# Patient Record
Sex: Male | Born: 1975 | Race: White | Hispanic: No | Marital: Single | Smoking: Never smoker
Health system: Southern US, Community
[De-identification: ages and names within clinical notes are randomized; demographics above are authoritative.]

## PROBLEM LIST (undated history)

## (undated) DIAGNOSIS — M1990 Unspecified osteoarthritis, unspecified site: Secondary | ICD-10-CM

## (undated) DIAGNOSIS — F319 Bipolar disorder, unspecified: Secondary | ICD-10-CM

## (undated) DIAGNOSIS — E7800 Pure hypercholesterolemia, unspecified: Secondary | ICD-10-CM

## (undated) DIAGNOSIS — F32A Depression, unspecified: Secondary | ICD-10-CM

## (undated) DIAGNOSIS — N189 Chronic kidney disease, unspecified: Secondary | ICD-10-CM

## (undated) HISTORY — PX: NASAL SINUS SURGERY: SHX719

---

## 2017-12-23 ENCOUNTER — Other Ambulatory Visit: Payer: Self-pay | Admitting: Nephrology

## 2017-12-23 DIAGNOSIS — Z87448 Personal history of other diseases of urinary system: Secondary | ICD-10-CM

## 2017-12-23 DIAGNOSIS — I159 Secondary hypertension, unspecified: Secondary | ICD-10-CM

## 2017-12-23 DIAGNOSIS — N183 Chronic kidney disease, stage 3 unspecified: Secondary | ICD-10-CM

## 2017-12-28 ENCOUNTER — Ambulatory Visit
Admission: RE | Admit: 2017-12-28 | Discharge: 2017-12-28 | Disposition: A | Discharge status: Home / Self Care | Payer: Managed Care, Other (non HMO) | Source: Ambulatory Visit | Attending: Nephrology | Admitting: Nephrology

## 2017-12-28 DIAGNOSIS — I159 Secondary hypertension, unspecified: Secondary | ICD-10-CM

## 2017-12-28 DIAGNOSIS — N183 Chronic kidney disease, stage 3 unspecified: Secondary | ICD-10-CM

## 2017-12-28 DIAGNOSIS — Z87448 Personal history of other diseases of urinary system: Secondary | ICD-10-CM

## 2019-06-16 IMAGING — US US RENAL
1 series · 14 of 25 positions shown · non-contrast
Comparison: None in PACs

CLINICAL DATA: Chronic kidney disease stage 3. History of
hypertension.

EXAM:
RENAL / URINARY TRACT ULTRASOUND COMPLETE

[Series 1: us renal · 0.28mm/px · 14 of 41 slices shown]
[im 1/41]
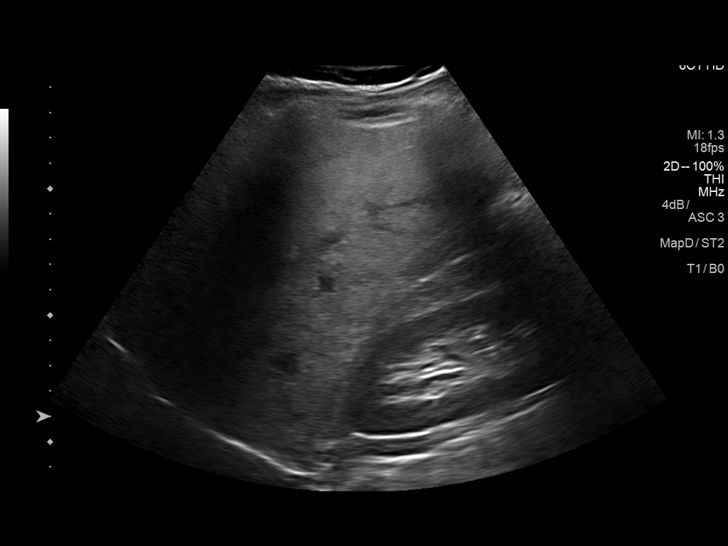
[im 4/41]
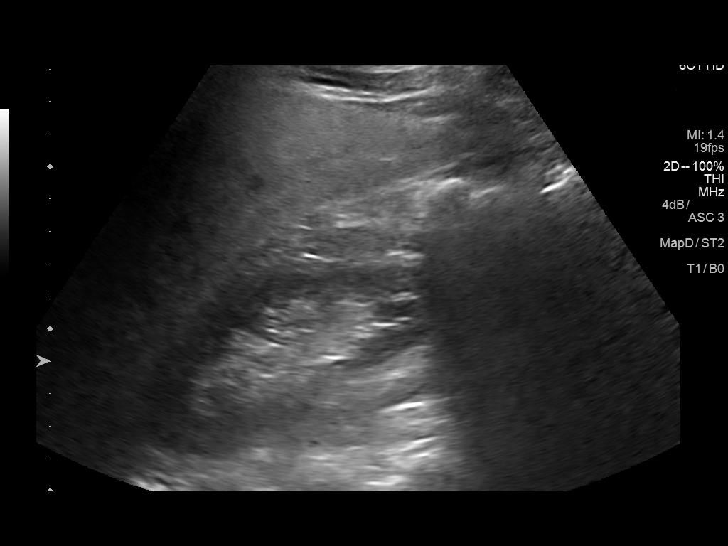
[im 7/41]
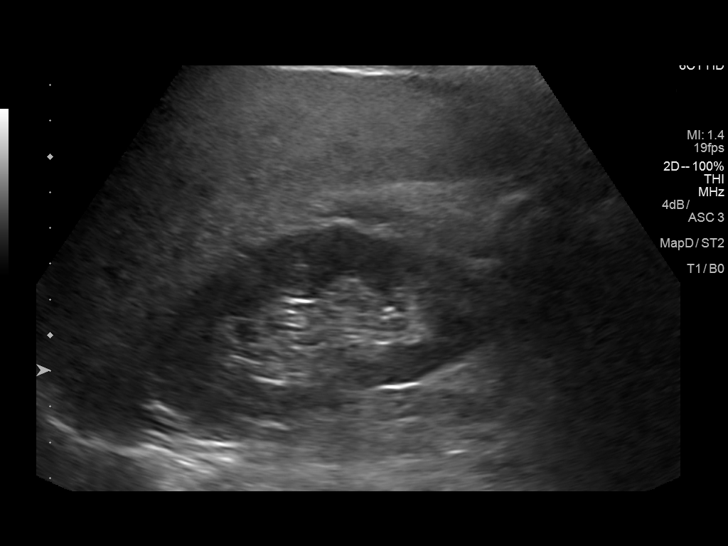
[im 11/41]
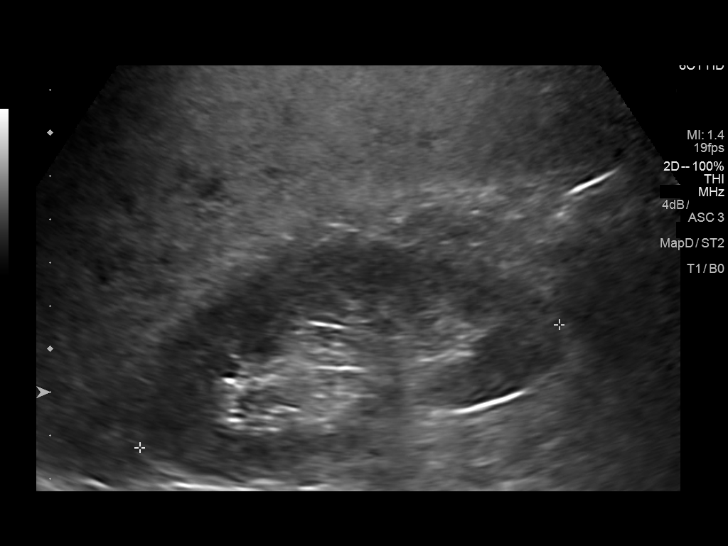
[im 14/41]
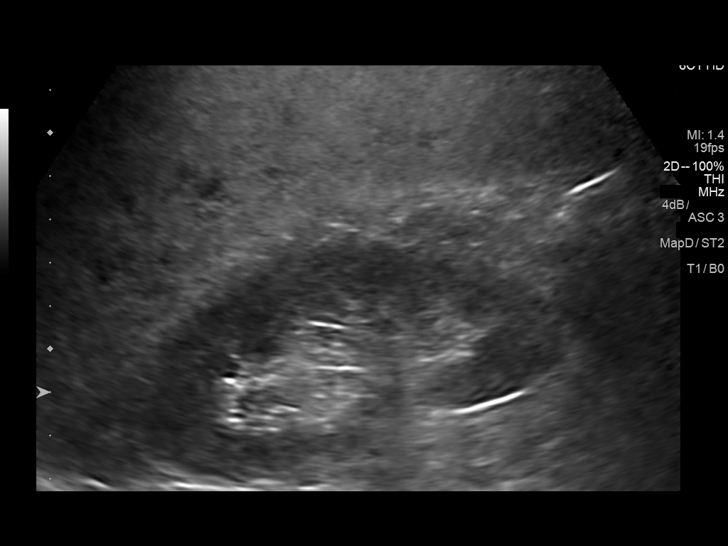
[im 16/41]
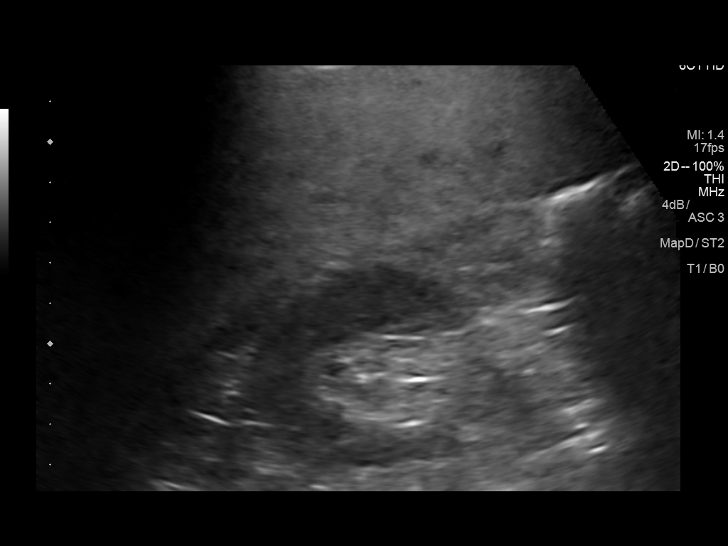
[im 19/41]
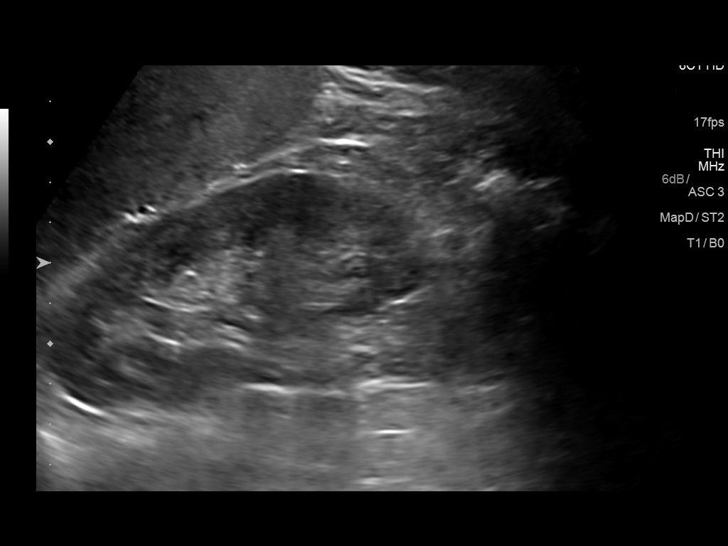
[im 22/41]
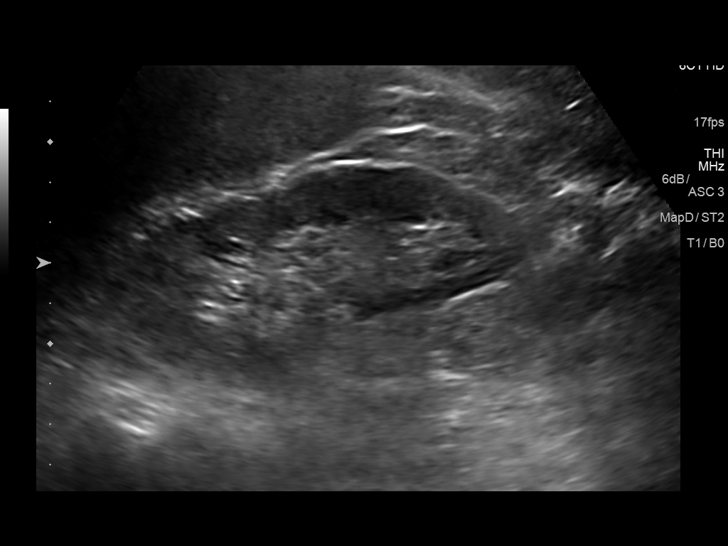
[im 26/41]
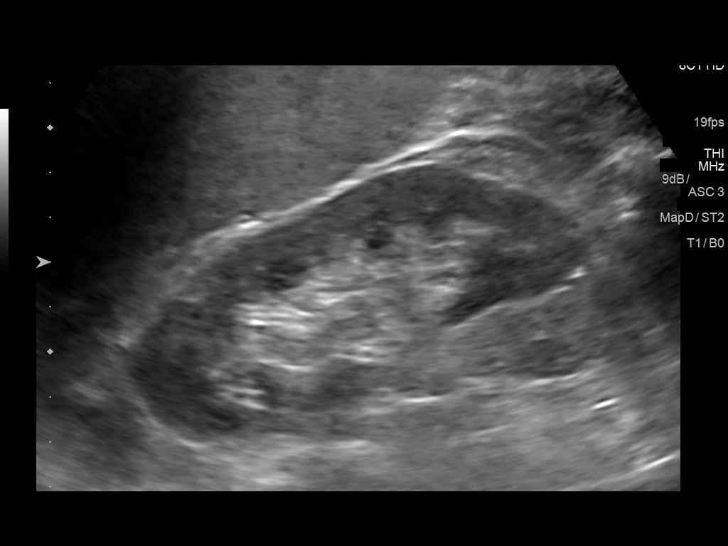
[im 27/41]
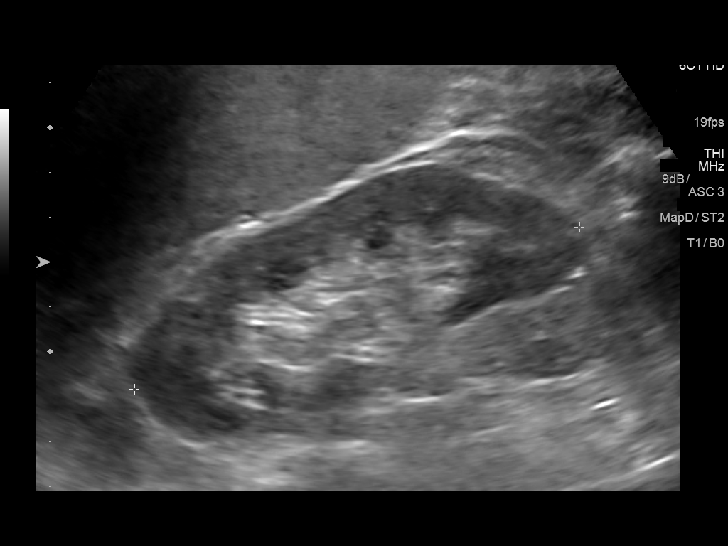
[im 31/41]
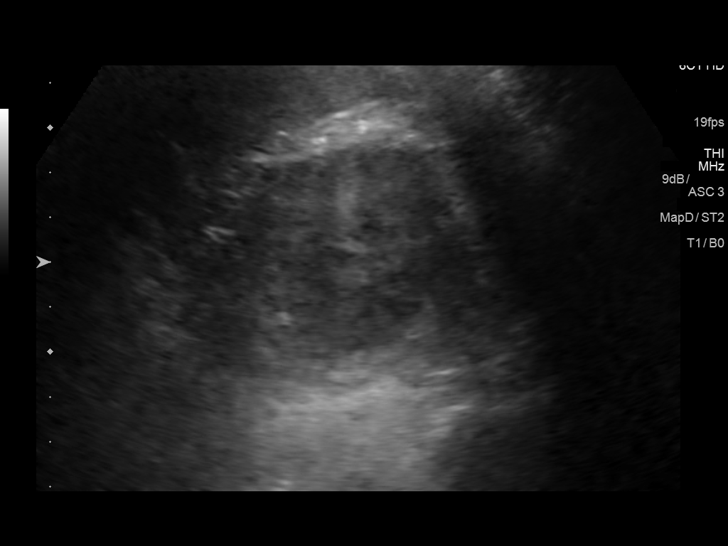
[im 34/41]
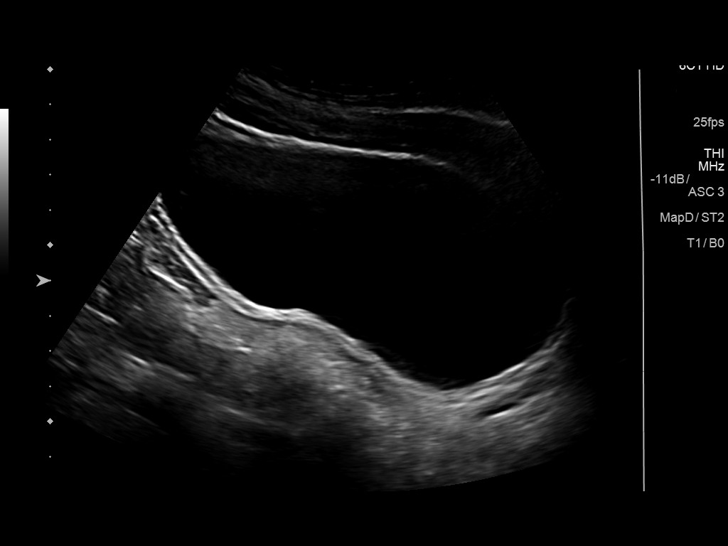
[im 37/41]
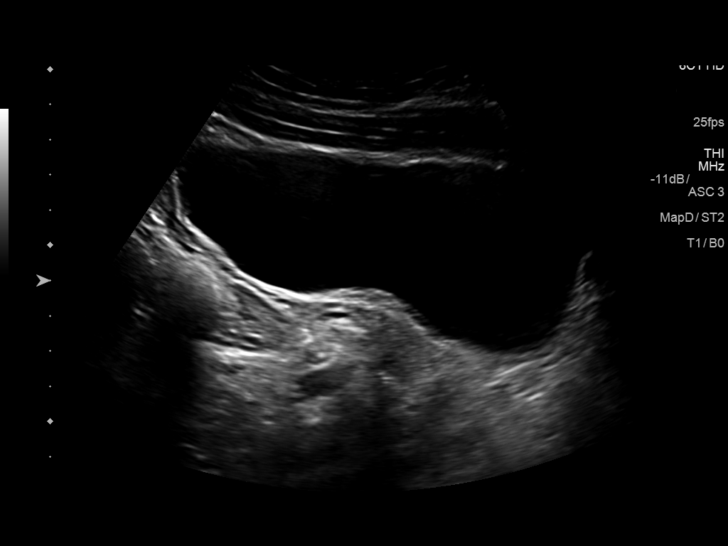
[im 41/41]
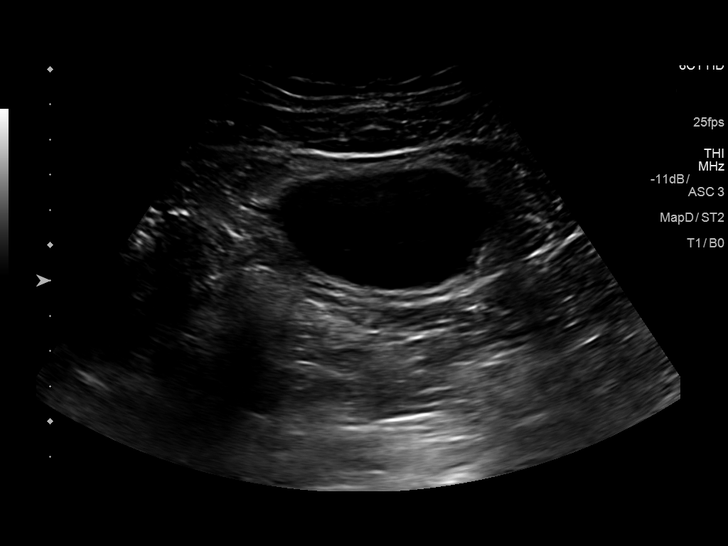

[14 of 25 positions shown; findings below may reference images not displayed]

FINDINGS: Right Kidney:

Length: 10.5 cm. Echogenicity within normal limits. No mass or
hydronephrosis visualized.

Left Kidney:

Length: 10.6 cm. Echogenicity within normal limits. No mass or
hydronephrosis visualized.

Bladder:

Appears normal for degree of bladder distention.
IMPRESSION: Normal ultrasound of the kidneys and urinary bladder.

## 2019-11-26 ENCOUNTER — Ambulatory Visit: Payer: Managed Care, Other (non HMO) | Attending: Internal Medicine

## 2019-11-26 DIAGNOSIS — Z23 Encounter for immunization: Secondary | ICD-10-CM

## 2019-11-26 NOTE — Progress Notes (Signed)
   Covid-19 Vaccination Clinic  Name:  Richard Sloan    MRN: [id number] DOB: 02-Feb-1976  11/26/2019  Mr. Richard Sloan was observed post Covid-19 immunization for 15 minutes without incident. He was provided with Vaccine Information Sheet and instruction to access the V-Safe system.   Mr. Richard Sloan was instructed to call 911 with any severe reactions post vaccine: Marland Kitchen Difficulty breathing  . Swelling of face and throat  . A fast heartbeat  . A bad rash all over body  . Dizziness and weakness   Immunizations Administered    Name Date Dose VIS Date Route   Pfizer COVID-19 Vaccine 11/26/2019  5:54 PM 0.3 mL 08/31/2019 Intramuscular   Manufacturer: [employer], [employer]   Lot: [id number]   NDC: [address]-0

## 2019-12-17 ENCOUNTER — Ambulatory Visit: Payer: Managed Care, Other (non HMO) | Attending: Internal Medicine

## 2019-12-17 DIAGNOSIS — Z23 Encounter for immunization: Secondary | ICD-10-CM

## 2019-12-17 NOTE — Progress Notes (Signed)
   Covid-19 Vaccination Clinic  Name:  Richard Sloan    MRN: [id number] DOB: 1976/07/15  12/17/2019  Mr. Richard Sloan was observed post Covid-19 immunization for 15 minutes without incident. He was provided with Vaccine Information Sheet and instruction to access the V-Safe system.   Mr. Richard Sloan was instructed to call 911 with any severe reactions post vaccine: Marland Kitchen Difficulty breathing  . Swelling of face and throat  . A fast heartbeat  . A bad rash all over body  . Dizziness and weakness   Immunizations Administered    Name Date Dose VIS Date Route   Pfizer COVID-19 Vaccine 12/17/2019  9:13 AM 0.3 mL 08/31/2019 Intramuscular   Manufacturer: [employer], [employer]   Lot: [id number]   NDC: [address]-2

## 2019-12-20 ENCOUNTER — Other Ambulatory Visit: Payer: Self-pay

## 2019-12-20 ENCOUNTER — Ambulatory Visit: Payer: BC Managed Care – PPO | Admitting: Plastic Surgery

## 2019-12-20 VITALS — BP 118/82 | HR 106 | Temp 98.4°F | Ht 67.0 in | Wt 210.0 lb

## 2019-12-20 DIAGNOSIS — H02403 Unspecified ptosis of bilateral eyelids: Secondary | ICD-10-CM

## 2019-12-20 NOTE — Progress Notes (Signed)
   Referring Provider Bryon Lions, PA-C [address] [place],  [place] [address]   CC: No chief complaint on file. Upper eyelid ptosis  Richard Sloan is an 44 y.o. male.  HPI: Patient presents to discuss upper eyelid drooping.  He has had this problem for years but has noticed worsening over the past 6 months.  His right side is worse than his left.  He notices his eyelids getting heavy and obstructing his superior visual field.  This gets worse later on in the day.  He wants to discuss upper eyelid surgery and correcting the asymmetry.  Not on File  No outpatient encounter medications on file as of 12/20/2019.   No facility-administered encounter medications on file as of 12/20/2019.  Medications include Xanax Lipitor Wellbutrin Colestid Truvada prazosin Seroquel  No past medical history on file. Chronic kidney disease stage Sloan.  Hypertension.  Fatty liver.  Hypercholesterolemia.  No family history on file.  Social History   Social History Narrative  . Not on file  Occasional chewing tobacco  Review of Systems General: Denies fevers, chills, weight loss CV: Denies chest pain, shortness of breath, palpitations  Physical Exam Vitals with BMI 12/20/2019  Height 5\' 7"   Weight 210 lbs  BMI 32.88  Systolic 118  Diastolic 82  Pulse 106    General:  No acute distress,  Alert and oriented, Non-Toxic, Normal speech and affect HEENT: Normocephalic atraumatic.  Extraocular is intact.  Cranial nerves grossly intact.  He has bilateral eyelid ptosis with a right side worse than the left side.  The MRD 1 on the right is about 2.5 mm.  The MRD 1 on the left is about 3 to 3.5 mm.  He has good levator function on both sides.  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  His brow is in a reasonable position on both sides.  He has no signs of scleral irritation.  The skin is resting on the lashes in the right upper lid, and is almost there on the left upper lid.   Assessment/Plan Patient presents with bilateral upper lid ptosis and dermatochalasis.  The right side is worse than the left.  We discussed upper lid blepharoplasty and ptosis repair.  We discussed the risk that include bleeding, infection, demonstrating structures, need for additional procedures.  I discussed that the procedure is correcting millimeters of asymmetry and is hard to get absolutely perfect.  I explained the potential for lagophthalmos if the suture is too tight.  I discussed the potential need for revision surgery should any of these occur.  Patient is fully understanding and will begin the process of submitting to insurance.  He knows he needs to get a visual field test and will help him with that.  Pictures were taken today with the lids taped and untaped.  All his questions were answered.  12/20/2019, 8:54 AM

## 2020-03-07 ENCOUNTER — Telehealth: Payer: Self-pay | Admitting: Plastic Surgery

## 2020-03-07 NOTE — Telephone Encounter (Signed)
Called Richard Sloan, discussed the denial from [place]. Richard Sloan indicated that he is switching back to [facility] coverage July 1, so we will wait until then to resubmit under that insurance rather than doing an appeal.

## 2021-05-19 ENCOUNTER — Other Ambulatory Visit: Payer: Self-pay

## 2021-05-19 ENCOUNTER — Ambulatory Visit (INDEPENDENT_AMBULATORY_CARE_PROVIDER_SITE_OTHER): Payer: BC Managed Care – PPO

## 2021-05-19 ENCOUNTER — Ambulatory Visit
Admission: EM | Admit: 2021-05-19 | Discharge: 2021-05-19 | Disposition: A | Discharge status: Home / Self Care | Payer: BC Managed Care – PPO | Attending: Emergency Medicine | Admitting: Emergency Medicine

## 2021-05-19 DIAGNOSIS — M79672 Pain in left foot: Secondary | ICD-10-CM | POA: Diagnosis not present

## 2021-05-19 DIAGNOSIS — S93602A Unspecified sprain of left foot, initial encounter: Secondary | ICD-10-CM | POA: Diagnosis not present

## 2021-05-19 HISTORY — DX: Unspecified osteoarthritis, unspecified site: M19.90

## 2021-05-19 HISTORY — DX: Pure hypercholesterolemia, unspecified: E78.00

## 2021-05-19 HISTORY — DX: Chronic kidney disease, unspecified: N18.9

## 2021-05-19 HISTORY — DX: Bipolar disorder, unspecified: F31.9

## 2021-05-19 HISTORY — DX: Depression, unspecified: F32.A

## 2021-05-19 NOTE — ED Triage Notes (Addendum)
Patient presents to Urgent Care with complaints of left foot injury from "stubbing foot against lawn furniture on Saturday." Patient concerned with possible fracture. Pt states treating pain with tylenol.

## 2021-05-19 NOTE — Discharge Instructions (Addendum)
Your x-ray was normal today.  May take 1000 mg of Tylenol 3-4 times a day as needed for pain.  Ice, elevate.  Ace wrap may be helpful.  You can try the stiff soled shoe for support.

## 2021-05-19 NOTE — ED Provider Notes (Signed)
HPI  SUBJECTIVE:  Richard Sloan is a 45 y.o. male who presents with left midfoot pain along the third and fourth metatarsals after stubbing his big toe against a hard object 4 days ago.  Patient admits to being intoxicated while this happened, and does not clearly remember the mechanism of injury.  He states that it is hard to walk.  Describes pain as throbbing, constant.  He is able to move his toes without any problem.  No bruising, redness.  He reports swelling.  He denies any pain along his big toe or direct trauma to the area of pain.  He has tried Tylenol 1000 mg 3 times daily with improvement in symptoms, symptoms are worse with walking.  He has a past medical history of left fifth metatarsal fracture 15 years ago and chronic kidney disease.  No history of diabetes, neuropathy, PAD/PVD.  GUR:KYHCWCB, Pincus Large, PA-C   Past Medical History:  Diagnosis Date   Arthritis    Bipolar 1 disorder (HCC)    Chronic kidney disease    Depression    High cholesterol     Past Surgical History:  Procedure Laterality Date   NASAL SINUS SURGERY      History reviewed. No pertinent family history.  Social History   Tobacco Use   Smoking status: Never   Smokeless tobacco: Current    Types: Chew  Vaping Use   Vaping Use: Never used  Substance Use Topics   Alcohol use: Yes    Comment: 12 pk a week   Drug use: Not Currently    No current facility-administered medications for this encounter.  Current Outpatient Medications:    atorvastatin (LIPITOR) 20 MG tablet, Take 1 tablet by mouth daily., Disp: , Rfl:    Brexpiprazole (REXULTI) 4 MG TABS, Take by mouth., Disp: , Rfl:    buPROPion (WELLBUTRIN XL) 300 MG 24 hr tablet, Take by mouth., Disp: , Rfl:    dicyclomine (BENTYL) 10 MG capsule, , Disp: , Rfl:    emtricitabine-tenofovir AF (DESCOVY) 200-25 MG tablet, TAKE ONE TABLET BY MOUTH ONCE DAILY WITH OR WITHOUT FOOD., Disp: , Rfl:    Omega-3 Fatty Acids (FISH OIL) 1000 MG CAPS, Take by  mouth., Disp: , Rfl:    QUEtiapine (SEROQUEL) 300 MG tablet, Take by mouth., Disp: , Rfl:    topiramate (TOPAMAX) 200 MG tablet, Take 1 tablet by mouth daily., Disp: , Rfl:    tretinoin (RETIN-A) 0.05 % cream, Apply topically at bedtime., Disp: , Rfl:    ALPRAZolam (XANAX) 0.5 MG tablet, Take 0.5 mg by mouth 2 (two) times daily as needed., Disp: , Rfl:    imiquimod (ALDARA) 5 % cream, Apply topically., Disp: , Rfl:    K Phos Mono-Sod Phos Di & Mono (PHOSPHA 250 NEUTRAL) 155-852-130 MG TABS, Take by mouth., Disp: , Rfl:    mometasone (NASONEX) 50 MCG/ACT nasal spray, 2 sprays by Both Nostrils route daily., Disp: , Rfl:    Multiple Vitamin (MULTIVITAMIN) capsule, Take 1 capsule by mouth daily., Disp: , Rfl:    PANCREAZE 16800-56800 units CPEP, Take by mouth., Disp: , Rfl:    prazosin (MINIPRESS) 2 MG capsule, Take 2 mg by mouth 2 (two) times daily., Disp: , Rfl:    TRINTELLIX 20 MG TABS tablet, Take 20 mg by mouth daily., Disp: , Rfl:   No Known Allergies   ROS  As noted in HPI.   Physical Exam  BP 126/77 (BP Location: Left Arm)   Pulse Marland Kitchen)  108   Temp 98.5 F (36.9 C) (Oral)   Resp 16   SpO2 95%   Constitutional: Well developed, well nourished, no acute distress Eyes:  EOMI, conjunctiva normal bilaterally HENT: Normocephalic, atraumatic,mucus membranes moist Respiratory: Normal inspiratory effort Cardiovascular: Normal rate GI: nondistended skin: No rash, skin intact Musculoskeletal: Left midfoot tender around the base of the third and fourth metatarsals.   No bruising, swelling.. Base of fifth metatarsal NT Skin intact. DP 2+. Refill less than 2 seconds. Sensation grossly intact. Patient able to move all toes actively.   no pain with inversion / eversion, plantarflexion.  Pain with dorsiflexion.  No Tenderness along the plantar fascia. Distal fibula NT, Medial malleolus NT,  Deltoid ligament NT, Lateral ligaments NT, Achilles NT. Patient able to bear weight while in department.   Neurologic: Alert & oriented x 3, no focal neuro deficits Psychiatric: Speech and behavior appropriate   ED Course   Medications - No data to display  Orders Placed This Encounter  Procedures   DG Foot Complete Left    Standing Status:   Standing    Number of Occurrences:   1    Order Specific Question:   Reason for Exam (SYMPTOM  OR DIAGNOSIS REQUIRED)    Answer:   Injury    No results found for this or any previous visit (from the past 24 hour(s)). DG Foot Complete Left  Result Date: 05/19/2021 CLINICAL DATA:  Left foot pain for 3 days after stubbing toe EXAM: LEFT FOOT - COMPLETE 3+ VIEW COMPARISON:  None. FINDINGS: There is no evidence of fracture or dislocation. There is no evidence of arthropathy or other focal bone abnormality. Soft tissues are unremarkable. IMPRESSION: Negative. Electronically Signed   By: Marnee Spring M.D.   On: 05/19/2021 08:59    ED Clinical Impression  1. Sprain of left foot, initial encounter      ED Assessment/Plan  Will check foot x-ray given the bony tenderness.  He has no tenderness along the big toe, which is the area he states that he stubbed.  May have sprained his foot, difficult to tell, patient has difficulty remembering as he was intoxicated when this occurred.  Advised Ace wrap, we can try stiff soled shoe, Tylenol 1000 mg 3-4 times a day as needed for pain.  Ice, elevate.  Follow-up with PMD as needed.  Reviewed imaging independently.  No fracture.  See radiology report for full details.  Patient with foot sprain.  X-ray negative for fracture.  Plan as above.  Discussed  imaging, MDM, treatment plan, and plan for follow-up with patient.  patient agrees with plan.   No orders of the defined types were placed in this encounter.     *This clinic note was created using Dragon dictation software. Therefore, there may be occasional mistakes despite careful proofreading.  ?    Domenick Gong, MD 05/20/21 [phone]

## 2021-06-15 ENCOUNTER — Encounter: Payer: Self-pay | Admitting: Emergency Medicine

## 2021-06-15 ENCOUNTER — Other Ambulatory Visit: Payer: Self-pay

## 2021-06-15 ENCOUNTER — Ambulatory Visit
Admission: EM | Admit: 2021-06-15 | Discharge: 2021-06-15 | Disposition: A | Discharge status: Home / Self Care | Payer: BC Managed Care – PPO | Attending: Urgent Care | Admitting: Urgent Care

## 2021-06-15 DIAGNOSIS — Z8616 Personal history of COVID-19: Secondary | ICD-10-CM

## 2021-06-15 DIAGNOSIS — Z9889 Other specified postprocedural states: Secondary | ICD-10-CM

## 2021-06-15 DIAGNOSIS — J329 Chronic sinusitis, unspecified: Secondary | ICD-10-CM | POA: Diagnosis not present

## 2021-06-15 MED ORDER — PREDNISONE 20 MG PO TABS: ORAL_TABLET | ORAL | 0 refills | Status: AC

## 2021-06-15 MED ORDER — AMOXICILLIN-POT CLAVULANATE 875-125 MG PO TABS: 1.0000 | ORAL_TABLET | Freq: Two times a day (BID) | ORAL | 0 refills | Status: DC

## 2021-06-15 NOTE — ED Provider Notes (Signed)
[facility]   MRN: [id number] DOB: March 15, 1976  Subjective:   Richard Sloan is a 45 y.o. male presenting for 10-day history of acute onset recurrent sinus congestion, sinus pressure.  Patient has a history of bad sinuses.  Has undergone multiple sinus surgeries.  Has not had trouble with them in the past few years, has had 1-2 sinus infections per year.  Usually does well with Augmentin and a prednisone course.  He did test positive for COVID-19 10 days ago.  Feels like he is cleared this.  No chest pain, shortness of breath, body aches, fevers.  No current facility-administered medications for this encounter.  Current Outpatient Medications:    ALPRAZolam (XANAX) 0.5 MG tablet, Take 0.5 mg by mouth 2 (two) times daily as needed., Disp: , Rfl:    atorvastatin (LIPITOR) 20 MG tablet, Take 1 tablet by mouth daily., Disp: , Rfl:    Brexpiprazole (REXULTI) 4 MG TABS, Take by mouth., Disp: , Rfl:    buPROPion (WELLBUTRIN XL) 300 MG 24 hr tablet, Take by mouth., Disp: , Rfl:    dicyclomine (BENTYL) 10 MG capsule, , Disp: , Rfl:    emtricitabine-tenofovir AF (DESCOVY) 200-25 MG tablet, TAKE ONE TABLET BY MOUTH ONCE DAILY WITH OR WITHOUT FOOD., Disp: , Rfl:    imiquimod (ALDARA) 5 % cream, Apply topically., Disp: , Rfl:    K Phos Mono-Sod Phos Di & Mono (PHOSPHA 250 NEUTRAL) 155-852-130 MG TABS, Take by mouth., Disp: , Rfl:    mometasone (NASONEX) 50 MCG/ACT nasal spray, 2 sprays by Both Nostrils route daily., Disp: , Rfl:    Multiple Vitamin (MULTIVITAMIN) capsule, Take 1 capsule by mouth daily., Disp: , Rfl:    Omega-3 Fatty Acids (FISH OIL) 1000 MG CAPS, Take by mouth., Disp: , Rfl:    PANCREAZE 16800-56800 units CPEP, Take by mouth., Disp: , Rfl:    prazosin (MINIPRESS) 2 MG capsule, Take 2 mg by mouth 2 (two) times daily., Disp: , Rfl:    QUEtiapine (SEROQUEL) 300 MG tablet, Take by mouth., Disp: , Rfl:    topiramate (TOPAMAX) 200 MG tablet, Take 1 tablet by mouth daily.,  Disp: , Rfl:    tretinoin (RETIN-A) 0.05 % cream, Apply topically at bedtime., Disp: , Rfl:    TRINTELLIX 20 MG TABS tablet, Take 20 mg by mouth daily., Disp: , Rfl:    No Known Allergies  Past Medical History:  Diagnosis Date   Arthritis    Bipolar 1 disorder (HCC)    Chronic kidney disease    Depression    High cholesterol      Past Surgical History:  Procedure Laterality Date   NASAL SINUS SURGERY      History reviewed. No pertinent family history.  Social History   Tobacco Use   Smoking status: Never   Smokeless tobacco: Current    Types: Chew  Vaping Use   Vaping Use: Never used  Substance Use Topics   Alcohol use: Yes    Comment: 12 pk a week   Drug use: Not Currently    ROS   Objective:   Vitals: BP 133/88 (BP Location: Left Arm)   Pulse (!) 112   Temp 98.4 F (36.9 C) (Oral)   Resp 20   SpO2 96%   Physical Exam Constitutional:      General: He is not in acute distress.    Appearance: Normal appearance. He is normal weight. He is not ill-appearing, toxic-appearing or diaphoretic.  HENT:  Head: Normocephalic and atraumatic.     Right Ear: Tympanic membrane, ear canal and external ear normal. There is no impacted cerumen.     Left Ear: Tympanic membrane, ear canal and external ear normal. There is no impacted cerumen.     Nose: Nose normal. No congestion or rhinorrhea.     Mouth/Throat:     Mouth: Mucous membranes are moist.     Pharynx: No oropharyngeal exudate or posterior oropharyngeal erythema.  Eyes:     General: No scleral icterus.       Right eye: No discharge.        Left eye: No discharge.     Extraocular Movements: Extraocular movements intact.     Conjunctiva/sclera: Conjunctivae normal.     Pupils: Pupils are equal, round, and reactive to light.  Cardiovascular:     Rate and Rhythm: Normal rate.  Pulmonary:     Effort: Pulmonary effort is normal.  Musculoskeletal:     Cervical back: Normal range of motion and neck supple. No  rigidity. No muscular tenderness.  Skin:    General: Skin is warm and dry.  Neurological:     General: No focal deficit present.     Mental Status: He is alert and oriented to person, place, and time.  Psychiatric:        Mood and Affect: Mood normal.        Behavior: Behavior normal.        Thought Content: Thought content normal.        Judgment: Judgment normal.    Assessment and Plan :   PDMP not reviewed this encounter.  1. Recurrent sinusitis   2. History of COVID-19   3. History of sinus surgery     CrCl calculated at 100mL/min based off of his last creatinine from this year. Will start empiric treatment for sinusitis with Augmentin and given his history of his difficult sinuses we will also use a prednisone course.  Recommended supportive care otherwise including the use of oral antihistamine.  Follow-up with ENT.  Counseled patient on potential for adverse effects with medications prescribed/recommended today, ER and return-to-clinic precautions discussed, patient verbalized understanding.    Wallis Bamberg, [place] 06/15/21 [phone]

## 2021-06-15 NOTE — ED Triage Notes (Signed)
Covid positive on 9/15, states he's had nasal congestion and nasal drainage since that hasn't gone away. Denies fever. Hx of 3 sinus surgeries and recurrent sinus infections. Reports frontal and maxillary sinus tenderness with palpations

## 2021-08-20 ENCOUNTER — Other Ambulatory Visit: Payer: Self-pay

## 2021-08-20 ENCOUNTER — Ambulatory Visit (LOCAL_COMMUNITY_HEALTH_CENTER): Payer: Self-pay

## 2021-08-20 DIAGNOSIS — Z719 Counseling, unspecified: Secondary | ICD-10-CM

## 2021-08-20 DIAGNOSIS — Z23 Encounter for immunization: Secondary | ICD-10-CM

## 2021-08-20 NOTE — Progress Notes (Signed)
1.Have you been close physical contact with someone diagnosed with monkeypox in the last 14 days? No   2. For men who have sex with me (MSM), or transgender individuals, did you in the last 90 days:               1. Have multiple or anonymous sex partners? No               2. Receive a diagnosis of a sexually transmitted infection?No               3. Receive HIV pre-exposure prophylaxis (PrEP)?No   3. Are you having any symptoms such as a new rash, lesions, or bumps?No   4. Do you have any allergies to egg protein, gentamicin, ciprofloxacin?No   5. Do you currently take Deflazacort (Calcort)?No   6. Have you had a previous dose of Smallpox vaccine in the last 3 years?No   7. Do you have a history of keloid scarring?No   8. Jynneos vaccine is a two-dose vaccine series.  Is this your first or second dose of Jynneos? This is the patient's second dose   Pt. Received 2nd dose 0.1 ml of MPX vaccine given ID to right forearm, tolerated well. Copy of updated [facility] given to pt. Ann Held, RN

## 2021-12-09 ENCOUNTER — Encounter (HOSPITAL_COMMUNITY): Payer: Self-pay

## 2021-12-09 ENCOUNTER — Ambulatory Visit (HOSPITAL_COMMUNITY)
Admission: EM | Admit: 2021-12-09 | Discharge: 2021-12-09 | Disposition: A | Discharge status: Home / Self Care | Payer: BC Managed Care – PPO | Attending: Internal Medicine | Admitting: Internal Medicine

## 2021-12-09 ENCOUNTER — Other Ambulatory Visit: Payer: Self-pay

## 2021-12-09 DIAGNOSIS — R000 Tachycardia, unspecified: Secondary | ICD-10-CM | POA: Diagnosis not present

## 2021-12-09 DIAGNOSIS — Z20822 Contact with and (suspected) exposure to covid-19: Secondary | ICD-10-CM | POA: Insufficient documentation

## 2021-12-09 DIAGNOSIS — R1110 Vomiting, unspecified: Secondary | ICD-10-CM | POA: Insufficient documentation

## 2021-12-09 DIAGNOSIS — J3489 Other specified disorders of nose and nasal sinuses: Secondary | ICD-10-CM | POA: Diagnosis not present

## 2021-12-09 DIAGNOSIS — B349 Viral infection, unspecified: Secondary | ICD-10-CM | POA: Insufficient documentation

## 2021-12-09 LAB — POC INFLUENZA A AND B ANTIGEN (URGENT CARE ONLY)
INFLUENZA A ANTIGEN, POC: NEGATIVE
INFLUENZA B ANTIGEN, POC: NEGATIVE

## 2021-12-09 LAB — SARS CORONAVIRUS 2 (TAT 6-24 HRS): SARS Coronavirus 2: NEGATIVE

## 2021-12-09 MED ORDER — ONDANSETRON HCL 4 MG PO TABS: 4.0000 mg | ORAL_TABLET | Freq: Four times a day (QID) | ORAL | 0 refills | Status: AC

## 2021-12-09 NOTE — ED Provider Notes (Signed)
?[facility] ? ? ? ?CSN: [id number] ?Arrival date & time: 12/09/21  0844 ? ? ?  ? ?History   ?Chief Complaint ?Chief Complaint  ?Patient presents with  ? Emesis  ? ? ?HPI ?Richard Sloan is a 46 y.o. male.  ? ?Vomiting, Congestion ?Started 2 days ago ?Was able to tolerate liquids last night and this morning ?No vomiting at this morning ?No abdominal pain ?Reports that congestion started last night ?Also having rhinorrhea ?Has a history of 3 prior sinus surgeries and recurrent sinus infections ?Denies any fevers, temp in the 99's at home ?No diarrhea ?Has been urinating at least 50% of his usual amount ?Overall feeling fatigued ?No sore throat ?Having fullness in his head, but no headache ?Denies chest pain or difficulty breathing ? ? ?Past Medical History:  ?Diagnosis Date  ? Arthritis   ? Bipolar 1 disorder (HCC)   ? Chronic kidney disease   ? Depression   ? High cholesterol   ? ? ?There are no problems to display for this patient. ? ? ?Past Surgical History:  ?Procedure Laterality Date  ? NASAL SINUS SURGERY    ? ? ? ? ? ?Home Medications   ? ?Prior to Admission medications   ?Medication Sig Start Date End Date Taking? Authorizing Provider  ?ondansetron (ZOFRAN) 4 MG tablet Take 1 tablet (4 mg total) by mouth every 6 (six) hours. 12/09/21  Yes Steadman Prosperi, Solmon IceBailey J, DO  ?ALPRAZolam (XANAX) 0.5 MG tablet Take 0.5 mg by mouth 2 (two) times daily as needed. 03/17/21   [provider]  ?amoxicillin-clavulanate (AUGMENTIN) 875-125 MG tablet Take 1 tablet by mouth 2 (two) times daily. 06/15/21   Wallis BambergMani, Mario, PA-C  ?atorvastatin (LIPITOR) 20 MG tablet Take 1 tablet by mouth daily. 07/13/19   [provider]  ?Brexpiprazole (REXULTI) 4 MG TABS Take by mouth. 06/21/17   [provider]  ?buPROPion (WELLBUTRIN XL) 300 MG 24 hr tablet Take by mouth. 07/12/19   [provider]  ?dicyclomine (BENTYL) 10 MG capsule  05/30/19   [provider]  ?emtricitabine-tenofovir AF  (DESCOVY) 200-25 MG tablet TAKE ONE TABLET BY MOUTH ONCE DAILY WITH OR WITHOUT FOOD. 04/09/21   [provider]  ?imiquimod (ALDARA) 5 % cream Apply topically. 02/26/21   [provider]  ?K Phos Mono-Sod Phos Di & Mono (PHOSPHA 250 NEUTRAL) [phone] MG TABS Take by mouth. 03/18/21   [provider]  ?mometasone (NASONEX) 50 MCG/ACT nasal spray 2 sprays by Both Nostrils route daily.    [provider]  ?Multiple Vitamin (MULTIVITAMIN) capsule Take 1 capsule by mouth daily.    [provider]  ?Omega-3 Fatty Acids (FISH OIL) 1000 MG CAPS Take by mouth. 05/14/16   [provider]  ?PANCREAZE [id number] units CPEP Take by mouth. 04/20/21   [provider]  ?prazosin (MINIPRESS) 2 MG capsule Take 2 mg by mouth 2 (two) times daily. 03/24/21   [provider]  ?predniSONE (DELTASONE) 20 MG tablet Take 2 tablets daily with breakfast. 06/15/21   Wallis BambergMani, Mario, PA-C  ?QUEtiapine (SEROQUEL) 300 MG tablet Take by mouth. 06/20/17   [provider]  ?topiramate (TOPAMAX) 200 MG tablet Take 1 tablet by mouth daily. 07/12/19   [provider]  ?tretinoin (RETIN-A) 0.05 % cream Apply topically at bedtime. 07/18/20   [provider]  ?TRINTELLIX 20 MG TABS tablet Take 20 mg by mouth daily. 03/18/21   [provider]  ? ? ?Family History ?History reviewed.  No pertinent family history. ? ?Social History ?Social History  ? ?Tobacco Use  ? Smoking status: Never  ? Smokeless tobacco: Current  ?  Types: Chew  ?Vaping Use  ? Vaping Use: Never used  ?Substance Use Topics  ? Alcohol use: Yes  ?  Comment: 12 pk a week  ? Drug use: Not Currently  ? ? ? ?Allergies   ?Patient has no known allergies. ? ? ?Review of Systems ?Review of Systems  ?All other systems reviewed and are negative. ? ? ?Physical Exam ?Triage Vital Signs ?ED Triage Vitals  ?Enc Vitals Group  ?   BP   ?   Pulse   ?   Resp   ?   Temp   ?   Temp src   ?   SpO2   ?   Weight   ?    Height   ?   Head Circumference   ?   Peak Flow   ?   Pain Score   ?   Pain Loc   ?   Pain Edu?   ?   Excl. in GC?   ? ?No data found. ? ?Updated Vital Signs ?BP (!) 143/90 (BP Location: Left Arm)   Pulse (!) 113   Temp 99.1 ?F (37.3 ?C) (Oral)   Resp 18   SpO2 98%  ? ?Visual Acuity ?Right Eye Distance:   ?Left Eye Distance:   ?Bilateral Distance:   ? ?Right Eye Near:   ?Left Eye Near:    ?Bilateral Near:    ? ?Physical Exam ?Constitutional:   ?   General: He is not in acute distress. ?   Appearance: Normal appearance. He is not ill-appearing.  ?HENT:  ?   Head: Normocephalic and atraumatic.  ?   Right Ear: Tympanic membrane, ear canal and external ear normal.  ?   Left Ear: Tympanic membrane, ear canal and external ear normal.  ?   Nose: Congestion and rhinorrhea present.  ?   Mouth/Throat:  ?   Mouth: Mucous membranes are moist.  ?   Pharynx: No oropharyngeal exudate or posterior oropharyngeal erythema.  ?   Comments: No uvula ?Eyes:  ?   Conjunctiva/sclera: Conjunctivae normal.  ?Cardiovascular:  ?   Rate and Rhythm: Regular rhythm. Tachycardia present.  ?   Heart sounds: No murmur heard. ?Pulmonary:  ?   Effort: Pulmonary effort is normal. No respiratory distress.  ?   Breath sounds: Normal breath sounds. No wheezing, rhonchi or rales.  ?Abdominal:  ?   Palpations: Abdomen is soft.  ?   Tenderness: There is no abdominal tenderness. There is no guarding or rebound.  ?Musculoskeletal:  ?   Cervical back: Normal range of motion and neck supple.  ?   Right lower leg: No edema.  ?   Left lower leg: No edema.  ?Lymphadenopathy:  ?   Cervical: No cervical adenopathy.  ?Skin: ?   General: Skin is warm and dry.  ?   Capillary Refill: Capillary refill takes less than 2 seconds.  ?Neurological:  ?   General: No focal deficit present.  ?   Mental Status: He is alert and oriented to person, place, and time.  ? ? ? ?UC Treatments / Results  ?Labs ?(all labs ordered are listed, but only abnormal results are  displayed) ?Labs Reviewed  ?SARS CORONAVIRUS 2 (TAT 6-24 HRS)  ?POC INFLUENZA A AND B ANTIGEN (URGENT CARE ONLY)  ? ? ?EKG ? ? ?Radiology ?No results  found. ? ?Procedures ?Procedures (including critical care time) ? ?Medications Ordered in UC ?Medications - No data to display ? ?Initial Impression / Assessment and Plan / UC Course  ?I have reviewed the triage vital signs and the nursing notes. ? ?Pertinent labs & imaging results that were available during my care of the patient were reviewed by me and considered in my medical decision making (see chart for details). ? ?  ? ? ?Tachycardia appears to be chronic on chart review, otherwise VSS.  COVID test performed.  POC Influenza test negative.  Rx sent for zofran as needed.  Advised of OTC treatments and ED precautions, see AVS.  ? ? ? ?Final Clinical Impressions(s) / UC Diagnoses  ? ?Final diagnoses:  ?Viral illness  ? ? ? ?Discharge Instructions   ? ?  ?I have sent a prescription to the pharmacy for you to use as needed for your nausea. ? ?We have tested you for COVID and the results will likely come back tomorrow.  Your flu test was negative. If you have difficulty breathing, chest pain, you are vomiting and can't keep any liquids down and you aren't urinating at least 50% of your normal amount, you should be seen at the emergency room right away.  If you aren't improving over the next week, please follow up with your regular medical provider. ? ?For your congestion, you can use nasal saline spray.  You can also use a humidifier.  You can also use honey as needed for cough by the spoonful or in a warm liquid (do not give honey to an infant less than a year old).  ? ? ? ? ?ED Prescriptions   ? ? Medication Sig Dispense Auth. Provider  ? ondansetron (ZOFRAN) 4 MG tablet Take 1 tablet (4 mg total) by mouth every 6 (six) hours. 12 tablet Rocsi Hazelbaker, Solmon Ice, DO  ? ?  ? ?PDMP not reviewed this encounter. ?  ?Unknown Jim, DO ?12/09/21 1130 ? ?

## 2021-12-09 NOTE — Discharge Instructions (Addendum)
I have sent a prescription to the pharmacy for you to use as needed for your nausea. ? ?We have tested you for COVID and the results will likely come back tomorrow.  Your flu test was negative. If you have difficulty breathing, chest pain, you are vomiting and can't keep any liquids down and you aren't urinating at least 50% of your normal amount, you should be seen at the emergency room right away.  If you aren't improving over the next week, please follow up with your regular medical provider. ? ?For your congestion, you can use nasal saline spray.  You can also use a humidifier.  You can also use honey as needed for cough by the spoonful or in a warm liquid (do not give honey to an infant less than a year old).  ?

## 2021-12-09 NOTE — ED Triage Notes (Signed)
2 day h/o emesis, nausea, feeling unbalanced with some dizziness and onset last night of runny nose. Has been taking mucinex. No diarrhea.  ?

## 2022-06-27 ENCOUNTER — Ambulatory Visit
Admission: EM | Admit: 2022-06-27 | Discharge: 2022-06-27 | Disposition: A | Discharge status: Home / Self Care | Payer: BC Managed Care – PPO | Attending: Internal Medicine | Admitting: Internal Medicine

## 2022-06-27 DIAGNOSIS — Z1152 Encounter for screening for COVID-19: Secondary | ICD-10-CM | POA: Diagnosis not present

## 2022-06-27 DIAGNOSIS — J069 Acute upper respiratory infection, unspecified: Secondary | ICD-10-CM | POA: Diagnosis present

## 2022-06-27 MED ORDER — BENZONATATE 100 MG PO CAPS: 100.0000 mg | ORAL_CAPSULE | Freq: Three times a day (TID) | ORAL | 0 refills | Status: AC | PRN

## 2022-06-27 NOTE — ED Triage Notes (Signed)
Pt c/o sore throat, nasal drainage, concerned for sinus infection or covid.   Onset ~ thurs. Reports hx of sinus infection. States sore throat has resolved. Denies pain.

## 2022-06-27 NOTE — Discharge Instructions (Addendum)
It appears that you have a viral upper respiratory infection that should run its course and self resolve with symptomatic treatment.  I have sent you a cough medication.  Please use Flonase as well.  Follow-up if symptoms persist or worsen.

## 2022-06-27 NOTE — ED Provider Notes (Addendum)
[facility]    CSN: [id number] Arrival date & time: 06/27/22  0801      History   Chief Complaint Chief Complaint  Patient presents with   nasal drainage    HPI Richard Sloan is a 46 y.o. male.   Patient presents with nasal drainage, nasal congestion, cough that started about 5 days ago.  Patient reports that he also had sore throat that is now resolved.  Denies any known fevers or sick contacts.  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  Denies chest pain, shortness of breath, nausea, vomiting, diarrhea, abdominal pain.  Patient has taken Sudafed with some improvement in symptoms.     Past Medical History:  Diagnosis Date   Arthritis    Bipolar 1 disorder (HCC)    Chronic kidney disease    Depression    High cholesterol     There are no problems to display for this patient.   Past Surgical History:  Procedure Laterality Date   NASAL SINUS SURGERY         Home Medications    Prior to Admission medications   Medication Sig Start Date End Date Taking? Authorizing Provider  benzonatate (TESSALON) 100 MG capsule Take 1 capsule (100 mg total) by mouth every 8 (eight) hours as needed for cough. 06/27/22  Yes Sephiroth Mcluckie, Acie Fredrickson, FNP  cariprazine (VRAYLAR) 1.5 MG capsule Take by mouth. 04/07/22  Yes [provider]  ALPRAZolam Prudy Feeler) 0.5 MG tablet Take 0.5 mg by mouth 2 (two) times daily as needed. 03/17/21   [provider]  amoxicillin-clavulanate (AUGMENTIN) 875-125 MG tablet Take 1 tablet by mouth 2 (two) times daily. 06/15/21   Wallis Bamberg, PA-C  atorvastatin (LIPITOR) 20 MG tablet Take 1 tablet by mouth daily. 07/13/19   [provider]  Brexpiprazole (REXULTI) 4 MG TABS Take by mouth. 06/21/17   [provider]  buPROPion (WELLBUTRIN XL) 300 MG 24 hr tablet Take by mouth. 07/12/19   [provider]  dicyclomine (BENTYL) 10 MG capsule  05/30/19    [provider]  emtricitabine-tenofovir AF (DESCOVY) 200-25 MG tablet TAKE ONE TABLET BY MOUTH ONCE DAILY WITH OR WITHOUT FOOD. 04/09/21   [provider]  imiquimod (ALDARA) 5 % cream Apply topically. 02/26/21   [provider]  K Phos Mono-Sod Phos Di & Mono (PHOSPHA 250 NEUTRAL) [phone] MG TABS Take by mouth. 03/18/21   [provider]  mometasone (NASONEX) 50 MCG/ACT nasal spray 2 sprays by Both Nostrils route daily.    [provider]  Multiple Vitamin (MULTIVITAMIN) capsule Take 1 capsule by mouth daily.    [provider]  Omega-3 Fatty Acids (FISH OIL) 1000 MG CAPS Take by mouth. 05/14/16   [provider]  ondansetron (ZOFRAN) 4 MG tablet Take 1 tablet (4 mg total) by mouth every 6 (six) hours. 12/09/21   Meccariello, Solmon Ice, MD  PANCREAZE [phone] units CPEP Take by mouth. 04/20/21   [provider]  prazosin (MINIPRESS) 2 MG capsule Take 2 mg by mouth 2 (two) times daily. 03/24/21   [provider]  predniSONE (DELTASONE) 20 MG tablet Take 2 tablets daily with breakfast. 06/15/21   Wallis Bamberg, PA-C  QUEtiapine (SEROQUEL) 300 MG tablet Take by mouth. 06/20/17   [provider]  topiramate (TOPAMAX) 200 MG tablet Take 1 tablet by mouth daily. 07/12/19   [provider]  tretinoin (RETIN-A)  0.05 % cream Apply topically at bedtime. 07/18/20   [provider]  TRINTELLIX 20 MG TABS tablet Take 20 mg by mouth daily. 03/18/21   [provider]    Family History History reviewed. No pertinent family history.  Social History Social History   Tobacco Use   Smoking status: Never   Smokeless tobacco: Current    Types: Chew  Vaping Use   Vaping Use: Never used  Substance Use Topics   Alcohol use: Yes    Comment: 12 pk a week   Drug use: Not Currently     Allergies   Patient has no known allergies.   Review of Systems Review of Systems Per HPI  Physical  Exam Triage Vital Signs ED Triage Vitals  Enc Vitals Group     BP 06/27/22 0811 138/80     Pulse Rate 06/27/22 0811 (!) 114     Resp 06/27/22 0811 16     Temp 06/27/22 0811 98 F (36.7 C)     Temp Source 06/27/22 0811 Oral     SpO2 06/27/22 0811 95 %     Weight --      Height --      Head Circumference --      Peak Flow --      Pain Score 06/27/22 0812 0     Pain Loc --      Pain Edu? --      Excl. in GC? --    No data found.  Updated Vital Signs BP 138/80 (BP Location: Left Arm)   Pulse (!) 114   Temp 98 F (36.7 C) (Oral)   Resp 16   SpO2 95%   Visual Acuity Right Eye Distance:   Left Eye Distance:   Bilateral Distance:    Right Eye Near:   Left Eye Near:    Bilateral Near:     Physical Exam Constitutional:      General: He is not in acute distress.    Appearance: Normal appearance. He is not toxic-appearing or diaphoretic.  HENT:     Head: Normocephalic and atraumatic.     Right Ear: Tympanic membrane and ear canal normal.     Left Ear: Tympanic membrane and ear canal normal.     Nose: Congestion present.     Mouth/Throat:     Mouth: Mucous membranes are moist.     Pharynx: No posterior oropharyngeal erythema.  Eyes:     Extraocular Movements: Extraocular movements intact.     Conjunctiva/sclera: Conjunctivae normal.     Pupils: Pupils are equal, round, and reactive to light.  Cardiovascular:     Rate and Rhythm: Normal rate and regular rhythm.     Pulses: Normal pulses.     Heart sounds: Normal heart sounds.  Pulmonary:     Effort: Pulmonary effort is normal. No respiratory distress.     Breath sounds: Normal breath sounds. No stridor. No wheezing, rhonchi or rales.  Abdominal:     General: Abdomen is flat. Bowel sounds are normal.     Palpations: Abdomen is soft.  Musculoskeletal:        General: Normal range of motion.     Cervical back: Normal range of motion.  Skin:    General: Skin is warm and dry.  Neurological:     General: No focal  deficit present.     Mental Status: He is alert and oriented to person, place, and time. Mental status is at baseline.  Psychiatric:  Mood and Affect: Mood normal.        Behavior: Behavior normal.      UC Treatments / Results  Labs (all labs ordered are listed, but only abnormal results are displayed) Labs Reviewed  SARS CORONAVIRUS 2 (TAT 6-24 HRS)    EKG   Radiology No results found.  Procedures Procedures (including critical care time)  Medications Ordered in UC Medications - No data to display  Initial Impression / Assessment and Plan / UC Course  I have reviewed the triage vital signs and the nursing notes.  Pertinent labs & imaging results that were available during my care of the patient were reviewed by me and considered in my medical decision making (see chart for details).     Patient presents with symptoms likely from a viral upper respiratory infection. Differential includes bacterial pneumonia, sinusitis, allergic rhinitis, Covid 19, flu, RSV. Do not suspect underlying cardiopulmonary process. Symptoms seem unlikely related to ACS, CHF or COPD exacerbations, pneumonia, pneumothorax. Patient is nontoxic appearing and not in need of emergent medical intervention. Covid test pending. Do not think that strep testing is necessary given appearance of posterior pharynx on exam.   Recommended symptom control with over the counter medications. Patient sent prescriptions. Advised flonase as well which he wishes to pick up OTC.   Elevated HR appears baseline for patient.   Return if symptoms fail to improve in 1-2 weeks or you develop shortness of breath, chest pain, severe headache. Patient states understanding and is agreeable.  Discharged with PCP followup.  Final Clinical Impressions(s) / UC Diagnoses   Final diagnoses:  Viral upper respiratory tract infection with cough     Discharge Instructions      It appears that you have a viral upper  respiratory infection that should run its course and self resolve with symptomatic treatment.  I have sent you a cough medication.  Please use Flonase as well.  Follow-up if symptoms persist or worsen.    ED Prescriptions     Medication Sig Dispense Auth. Provider   benzonatate (TESSALON) 100 MG capsule Take 1 capsule (100 mg total) by mouth every 8 (eight) hours as needed for cough. 21 capsule [place], Michele Rockers, [place]      PDMP not reviewed this encounter.   Teodora Medici, [place] 06/27/22 [place], [place], [place] 06/27/22 [phone]

## 2022-06-28 LAB — SARS CORONAVIRUS 2 (TAT 6-24 HRS): SARS Coronavirus 2: NEGATIVE

## 2022-07-02 ENCOUNTER — Ambulatory Visit
Admission: EM | Admit: 2022-07-02 | Discharge: 2022-07-02 | Disposition: A | Discharge status: Home / Self Care | Payer: BC Managed Care – PPO | Attending: Physician Assistant | Admitting: Physician Assistant

## 2022-07-02 DIAGNOSIS — J0140 Acute pansinusitis, unspecified: Secondary | ICD-10-CM | POA: Diagnosis not present

## 2022-07-02 DIAGNOSIS — H1013 Acute atopic conjunctivitis, bilateral: Secondary | ICD-10-CM | POA: Diagnosis not present

## 2022-07-02 MED ORDER — AMOXICILLIN-POT CLAVULANATE 875-125 MG PO TABS: 1.0000 | ORAL_TABLET | Freq: Two times a day (BID) | ORAL | 0 refills | Status: AC

## 2022-07-02 MED ORDER — OLOPATADINE HCL 0.1 % OP SOLN: 1.0000 [drp] | Freq: Two times a day (BID) | OPHTHALMIC | 0 refills | Status: AC

## 2022-07-02 NOTE — ED Provider Notes (Signed)
[facility]    CSN: [id number] Arrival date & time: 07/02/22  0807      History   Chief Complaint Chief Complaint  Patient presents with   URI    HPI Richard Sloan is a 46 y.o. male.   Patient presents today with a 1 week history of URI symptoms.  He reports cough, congestion, eye itching/crusting, sinus pressure, headache.  Denies any fever, chest pain, shortness of breath, nausea, vomiting, diarrhea.  He was seen 06/27/2022 at which point he was negative for COVID and given Tessalon Perles.  He has been using Tessalon as well as Mucinex and Sudafed without improvement of symptoms.  He does have a history of recurrent sinus infections.  Denies any recent antibiotic use in the past 90 days.  He has had several sinus surgeries with last in 2017.  He does not smoke but does use smokeless tobacco.  Denies history of asthma or COPD.  He is having difficulty with daily activities as result of symptoms.    Past Medical History:  Diagnosis Date   Arthritis    Bipolar 1 disorder (HCC)    Chronic kidney disease    Depression    High cholesterol     There are no problems to display for this patient.   Past Surgical History:  Procedure Laterality Date   NASAL SINUS SURGERY         Home Medications    Prior to Admission medications   Medication Sig Start Date End Date Taking? Authorizing Provider  olopatadine (PATADAY) 0.1 % ophthalmic solution Place 1 drop into both eyes 2 (two) times daily. 07/02/22  Yes Aneshia Jacquet, Denny Peon K, PA-C  ALPRAZolam (XANAX) 0.5 MG tablet Take 0.5 mg by mouth 2 (two) times daily as needed. 03/17/21   [provider]  amoxicillin-clavulanate (AUGMENTIN) 875-125 MG tablet Take 1 tablet by mouth 2 (two) times daily. 07/02/22   Seirra Kos, Denny Peon K, PA-C  atorvastatin (LIPITOR) 20 MG tablet Take 1 tablet by mouth daily. 07/13/19   [provider]  benzonatate (TESSALON) 100 MG capsule Take 1 capsule (100 mg total) by mouth every 8  (eight) hours as needed for cough. 06/27/22   Mound, Acie Fredrickson, FNP  Brexpiprazole (REXULTI) 4 MG TABS Take by mouth. 06/21/17   [provider]  buPROPion (WELLBUTRIN XL) 300 MG 24 hr tablet Take by mouth. 07/12/19   [provider]  cariprazine (VRAYLAR) 1.5 MG capsule Take by mouth. 04/07/22   [provider]  dicyclomine (BENTYL) 10 MG capsule  05/30/19   [provider]  emtricitabine-tenofovir AF (DESCOVY) 200-25 MG tablet TAKE ONE TABLET BY MOUTH ONCE DAILY WITH OR WITHOUT FOOD. 04/09/21   [provider]  imiquimod (ALDARA) 5 % cream Apply topically. 02/26/21   [provider]  K Phos Mono-Sod Phos Di & Mono (PHOSPHA 250 NEUTRAL) [phone] MG TABS Take by mouth. 03/18/21   [provider]  mometasone (NASONEX) 50 MCG/ACT nasal spray 2 sprays by Both Nostrils route daily.    [provider]  Multiple Vitamin (MULTIVITAMIN) capsule Take 1 capsule by mouth daily.    [provider]  Omega-3 Fatty Acids (FISH OIL) 1000 MG CAPS Take by mouth. 05/14/16   [provider]  ondansetron (ZOFRAN) 4 MG tablet Take 1 tablet (4 mg total) by mouth every 6 (six) hours. 12/09/21   Meccariello, Solmon Ice, MD  PANCREAZE [phone] units CPEP Take by mouth. 04/20/21   [provider]  prazosin (  MINIPRESS) 2 MG capsule Take 2 mg by mouth 2 (two) times daily. 03/24/21   [provider]  predniSONE (DELTASONE) 20 MG tablet Take 2 tablets daily with breakfast. 06/15/21   Jaynee Eagles, PA-C  QUEtiapine (SEROQUEL) 300 MG tablet Take by mouth. 06/20/17   [provider]  topiramate (TOPAMAX) 200 MG tablet Take 1 tablet by mouth daily. 07/12/19   [provider]  tretinoin (RETIN-A) 0.05 % cream Apply topically at bedtime. 07/18/20   [provider]  TRINTELLIX 20 MG TABS tablet Take 20 mg by mouth daily. 03/18/21   [provider]    Family History Family History  Family history unknown:  Yes    Social History Social History   Tobacco Use   Smoking status: Never   Smokeless tobacco: Current    Types: Chew  Vaping Use   Vaping Use: Never used  Substance Use Topics   Alcohol use: Yes    Comment: 12 pk a week   Drug use: Not Currently     Allergies   Patient has no known allergies.   Review of Systems Review of Systems  Constitutional:  Positive for activity change. Negative for appetite change, fatigue and fever.  HENT:  Positive for congestion and sinus pressure. Negative for postnasal drip, sneezing and sore throat.   Eyes:  Positive for discharge and itching. Negative for photophobia, pain, redness and visual disturbance.  Respiratory:  Positive for cough. Negative for shortness of breath.   Cardiovascular:  Negative for chest pain.  Gastrointestinal:  Negative for abdominal pain, diarrhea, nausea and vomiting.  Neurological:  Negative for dizziness, light-headedness and headaches.     Physical Exam Triage Vital Signs ED Triage Vitals  Enc Vitals Group     BP 07/02/22 0818 (!) 140/82     Pulse Rate 07/02/22 0818 93     Resp 07/02/22 0818 17     Temp 07/02/22 0818 98.3 F (36.8 C)     Temp Source 07/02/22 0818 Oral     SpO2 07/02/22 0818 96 %     Weight --      Height --      Head Circumference --      Peak Flow --      Pain Score 07/02/22 0819 4     Pain Loc --      Pain Edu? --      Excl. in [place]? --    No data found.  Updated Vital Signs BP (!) 140/82 (BP Location: Left Arm)   Pulse 93   Temp 98.3 F (36.8 C) (Oral)   Resp 17   SpO2 96%   Visual Acuity Right Eye Distance:   Left Eye Distance:   Bilateral Distance:    Right Eye Near:   Left Eye Near:    Bilateral Near:     Physical Exam Vitals reviewed.  Constitutional:      General: He is awake.     Appearance: Normal appearance. He is well-developed. He is not ill-appearing.     Comments: Very pleasant male appears stated age in no acute distress sitting comfortably in  exam room  HENT:     Head: Normocephalic and atraumatic.     Right Ear: Tympanic membrane, ear canal and external ear normal. Tympanic membrane is not erythematous or bulging.     Left Ear: Tympanic membrane, ear canal and external ear normal. Tympanic membrane is not erythematous or bulging.     Nose: Nose normal.  Mouth/Throat:     Pharynx: Posterior oropharyngeal erythema present. No oropharyngeal exudate.     Comments: Uvula surgically absent Eyes:     Extraocular Movements: Extraocular movements intact.     Conjunctiva/sclera: Conjunctivae normal.     Pupils: Pupils are equal, round, and reactive to light.  Cardiovascular:     Rate and Rhythm: Normal rate and regular rhythm.     Heart sounds: Normal heart sounds, S1 normal and S2 normal. No murmur heard. Pulmonary:     Effort: Pulmonary effort is normal. No accessory muscle usage or respiratory distress.     Breath sounds: Normal breath sounds. No stridor. No wheezing, rhonchi or rales.     Comments: Clear to auscultation bilaterally Neurological:     Mental Status: He is alert.  Psychiatric:        Behavior: Behavior is cooperative.      UC Treatments / Results  Labs (all labs ordered are listed, but only abnormal results are displayed) Labs Reviewed - No data to display  EKG   Radiology No results found.  Procedures Procedures (including critical care time)  Medications Ordered in UC Medications - No data to display  Initial Impression / Assessment and Plan / UC Course  I have reviewed the triage vital signs and the nursing notes.  Pertinent labs & imaging results that were available during my care of the patient were reviewed by me and considered in my medical decision making (see chart for details).     Concern for secondary bacterial infection given prolonged and worsening symptoms.  Patient is well-appearing, afebrile, nontoxic, nontachycardic with no indication for emergent evaluation or imaging.   Patient was started on Augmentin 875/125 twice daily for 10 days.  Recommended continuing over-the-counter medications including Mucinex, Flonase, Tylenol.  He is to rest and drink plenty of fluids.  I suspect that eye irritation is related to allergic conjunctivitis and he was prescribed Pataday drops to be used as needed.  Can also use lubricating eyedrops for additional symptom relief.  If his symptoms are not improving by next week he is to return for reevaluation.  If he has any worsening symptoms including chest pain, shortness of breath, fever, nausea, vomiting, weakness he needs to be seen immediately.  Strict return precautions given.  Work excuse note provided.  Final Clinical Impressions(s) / UC Diagnoses   Final diagnoses:  Acute non-recurrent pansinusitis  Allergic conjunctivitis of both eyes     Discharge Instructions      Start Augmentin to cover for sinus infection.  Continue over-the-counter medications including Mucinex, Flonase, Tylenol.  Make sure you are resting and drinking plenty of fluid.  Use Pataday drops to help with your eye itching and drainage.  You can also use lubricating eyedrops/artificial tears for additional symptom relief.  If your symptoms are not improving by next week return for reevaluation.  If you have any worsening symptoms including fever, shortness of breath, chest pain, worsening cough, nausea/vomiting, weakness you should be seen immediately.     ED Prescriptions     Medication Sig Dispense Auth. Provider   amoxicillin-clavulanate (AUGMENTIN) 875-125 MG tablet Take 1 tablet by mouth 2 (two) times daily. 20 tablet Brilynn Biasi K, PA-C   olopatadine (PATADAY) 0.1 % ophthalmic solution Place 1 drop into both eyes 2 (two) times daily. 5 mL Frank Pilger K, PA-C      PDMP not reviewed this encounter.   Jeani Hawking, PA-C 07/02/22 0845

## 2022-07-02 NOTE — ED Triage Notes (Signed)
Pt presents with productive cough, nasal congestion, eye drainage & crustiness X 1 week with no relief with tessalon perles, mucinex d & sudafed.

## 2022-07-02 NOTE — Discharge Instructions (Signed)
Start Augmentin to cover for sinus infection.  Continue over-the-counter medications including Mucinex, Flonase, Tylenol.  Make sure you are resting and drinking plenty of fluid.  Use Pataday drops to help with your eye itching and drainage.  You can also use lubricating eyedrops/artificial tears for additional symptom relief.  If your symptoms are not improving by next week return for reevaluation.  If you have any worsening symptoms including fever, shortness of breath, chest pain, worsening cough, nausea/vomiting, weakness you should be seen immediately.

## 2022-11-09 ENCOUNTER — Other Ambulatory Visit: Payer: Self-pay

## 2022-11-09 ENCOUNTER — Encounter: Payer: Self-pay | Admitting: Emergency Medicine

## 2022-11-09 ENCOUNTER — Ambulatory Visit
Admission: EM | Admit: 2022-11-09 | Discharge: 2022-11-09 | Disposition: A | Discharge status: Home / Self Care | Payer: Commercial Managed Care - PPO | Attending: Physician Assistant | Admitting: Physician Assistant

## 2022-11-09 DIAGNOSIS — M79641 Pain in right hand: Secondary | ICD-10-CM | POA: Diagnosis not present

## 2022-11-09 DIAGNOSIS — L03113 Cellulitis of right upper limb: Secondary | ICD-10-CM

## 2022-11-09 MED ORDER — CEPHALEXIN 500 MG PO CAPS: 500.0000 mg | ORAL_CAPSULE | Freq: Four times a day (QID) | ORAL | 0 refills | Status: AC

## 2022-11-09 NOTE — ED Provider Notes (Signed)
[facility]    CSN: [id number] Arrival date & time: 11/09/22  1319      History   Chief Complaint Chief Complaint  Patient presents with   Wound Check    HPI Richard Sloan is a 47 y.o. male.   47 year old male presents with right hand pain, redness and swelling.  Patient indicates that he had multiple skin lesions frozen on the right and left hand 5 days ago.  Patient indicates the areas blistered up over a couple days, several of the blisters ruptured and drained.  Patient indicates for the past couple days he has been having right hand pain, swelling, and redness on the dorsum of the hand.  Patient indicates he also had some blistering of the left hand but it has minimal pain associated no swelling.  Patient indicates he has not have any fever, chills, numbness, tingling or weakness of the right hand.  Patient indicates he has not taken any OTC medications.  Patient is concerned about a possible infection of the right hand.   Wound Check    Past Medical History:  Diagnosis Date   Arthritis    Bipolar 1 disorder ([place])    Chronic kidney disease    Depression    High cholesterol     There are no problems to display for this patient.   Past Surgical History:  Procedure Laterality Date   NASAL SINUS SURGERY         Home Medications    Prior to Admission medications   Medication Sig Start Date End Date Taking? Authorizing Provider  cephALEXin (KEFLEX) 500 MG capsule Take 1 capsule (500 mg total) by mouth 4 (four) times daily. 11/09/22  Yes Richard Lint, PA-C  ALPRAZolam Duanne Moron) 0.5 MG tablet Take 0.5 mg by mouth 2 (two) times daily as needed. 03/17/21   [provider]  amoxicillin-clavulanate (AUGMENTIN) 875-125 MG tablet Take 1 tablet by mouth 2 (two) times daily. 07/02/22   Raspet, Junie Panning K, PA-C  atorvastatin (LIPITOR) 20 MG tablet Take 1 tablet by mouth daily. 07/13/19   [provider]  benzonatate (TESSALON) 100 MG capsule Take  1 capsule (100 mg total) by mouth every 8 (eight) hours as needed for cough. 06/27/22   Mound, Michele Rockers, FNP  Brexpiprazole (REXULTI) 4 MG TABS Take by mouth. 06/21/17   [provider]  buPROPion (WELLBUTRIN XL) 300 MG 24 hr tablet Take by mouth. 07/12/19   [provider]  cariprazine (VRAYLAR) 1.5 MG capsule Take by mouth. 04/07/22   [provider]  dicyclomine (BENTYL) 10 MG capsule  05/30/19   [provider]  emtricitabine-tenofovir AF (DESCOVY) 200-25 MG tablet TAKE ONE TABLET BY MOUTH ONCE DAILY WITH OR WITHOUT FOOD. 04/09/21   [provider]  imiquimod (ALDARA) 5 % cream Apply topically. 02/26/21   [provider]  K Phos Mono-Sod Phos Di & Mono (PHOSPHA 250 NEUTRAL) [phone] MG TABS Take by mouth. 03/18/21   [provider]  mometasone (NASONEX) 50 MCG/ACT nasal spray 2 sprays by Both Nostrils route daily.    [provider]  Multiple Vitamin (MULTIVITAMIN) capsule Take 1 capsule by mouth daily.    [provider]  olopatadine (PATADAY) 0.1 % ophthalmic solution Place 1 drop into both eyes 2 (two) times daily. 07/02/22   Raspet, Derry Skill, PA-C  Omega-3 Fatty Acids (FISH OIL) 1000 MG CAPS Take by mouth. 05/14/16   [provider]  ondansetron (ZOFRAN) 4 MG tablet Take 1  tablet (4 mg total) by mouth every 6 (six) hours. 12/09/21   Meccariello, Bernita Raisin, MD  PANCREAZE [phone] units CPEP Take by mouth. 04/20/21   [provider]  prazosin (MINIPRESS) 2 MG capsule Take 2 mg by mouth 2 (two) times daily. 03/24/21   [provider]  predniSONE (DELTASONE) 20 MG tablet Take 2 tablets daily with breakfast. 06/15/21   Jaynee Eagles, PA-C  QUEtiapine (SEROQUEL) 300 MG tablet Take by mouth. 06/20/17   [provider]  topiramate (TOPAMAX) 200 MG tablet Take 1 tablet by mouth daily. 07/12/19   [provider]  tretinoin (RETIN-A) 0.05 % cream Apply topically at bedtime. 07/18/20   [provider]  TRINTELLIX 20 MG TABS tablet Take 20 mg by mouth daily. 03/18/21   [provider]    Family History Family History  Family history unknown: Yes    Social History Social History   Tobacco Use   Smoking status: Never   Smokeless tobacco: Current    Types: Chew  Vaping Use   Vaping Use: Never used  Substance Use Topics   Alcohol use: Yes    Comment: 12 pk a week   Drug use: Not Currently     Allergies   Patient has no known allergies.   Review of Systems Review of Systems  Skin:  Positive for wound (right hand swelling).     Physical Exam Triage Vital Signs ED Triage Vitals [11/09/22 1445]  Enc Vitals Group     BP 135/83     Pulse Rate 100     Resp 18     Temp 98.3 F (36.8 C)     Temp Source Oral     SpO2 96 %     Weight      Height      Head Circumference      Peak Flow      Pain Score 0     Pain Loc      Pain Edu?      Excl. in [place]?    No data found.  Updated Vital Signs BP 135/83 (BP Location: Left Arm)   Pulse 100   Temp 98.3 F (36.8 C) (Oral)   Resp 18   SpO2 96%   Visual Acuity Right Eye Distance:   Left Eye Distance:   Bilateral Distance:    Right Eye Near:   Left Eye Near:    Bilateral Near:    Right hand:   Left hand:     Physical Exam Constitutional:      Appearance: Normal appearance.  Skin:    Comments: Right hand: There are several skin lesion areas that have been frozen and there is blistering present.  3 blister areas on the posterior hand have increased redness, swelling that is 1+.  There is no streaking there is no active drainage.  (Refer to pictures) Left hand: There is several skin lesions that have been frozen and blistering is present without redness or swelling. Full range of motion is normal both hands, flexion extension is normal. Patient advised to remove all rings from fingers on both hands until condition resolves.  Neurological:     Mental Status: He is alert.      UC  Treatments / Results  Labs (all labs ordered are listed, but only abnormal results are displayed) Labs Reviewed - No data to display  EKG   Radiology No results found.  Procedures Procedures (including critical care time)  Medications Ordered in  UC Medications - No data to display  Initial Impression / Assessment and Plan / UC Course  I have reviewed the triage vital signs and the nursing notes.  Pertinent labs & imaging results that were available during my care of the patient were reviewed by me and considered in my medical decision making (see chart for details).    Plan: The diagnosis will be treated with the following: 1.  Cellulitis of the right hand: A.  Keflex 500 mg every 6 hours to treat cellulitis. B.  Epsom salt soaks to help reduce swelling and discomfort. 2.  Right hand pain: A.  Advised take ibuprofen on a regular basis to reduce swelling and pain. B.  Patient advised to remove rings from both hands until swelling and infection has resolved. 3.  Patient advised follow-up with PCP or return to urgent care as needed. Final Clinical Impressions(s) / UC Diagnoses   Final diagnoses:  Cellulitis of right hand  Right hand pain     Discharge Instructions      Advised to use Epsom salt soaks to help reduce pain and swelling, 1 tablespoon Epsom salt and lukewarm water, soak for 10-15 minutes, 3-4 times throughout the day. Advised take ibuprofen or Tylenol as needed for pain. Advised take Keflex 500 mg, 2 every 12 hours until completed.  Advised follow-up PCP or return to urgent care if symptoms fail to improve.     ED Prescriptions     Medication Sig Dispense Auth. Provider   cephALEXin (KEFLEX) 500 MG capsule Take 1 capsule (500 mg total) by mouth 4 (four) times daily. 20 capsule Richard Lint, PA-C      PDMP not reviewed this encounter.   Richard Lint, PA-C 11/09/22 [phone]

## 2022-11-09 NOTE — Discharge Instructions (Signed)
Advised to use Epsom salt soaks to help reduce pain and swelling, 1 tablespoon Epsom salt and lukewarm water, soak for 10-15 minutes, 3-4 times throughout the day. Advised take ibuprofen or Tylenol as needed for pain. Advised take Keflex 500 mg, 2 every 12 hours until completed.  Advised follow-up PCP or return to urgent care if symptoms fail to improve.

## 2022-11-09 NOTE — ED Triage Notes (Signed)
Pt here after having multiple areas frozen off of his hands for skin cancer on Friday; pt sts swelling in right hand is not normal

## 2024-05-09 NOTE — Telephone Encounter (Signed)
 5 minutes spent documenting, ordering referral, responding to patient regarding request for Apretude injections. 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 Interested in setting up injectable PrEP management with ID team.

## 2024-05-14 ENCOUNTER — Telehealth: Payer: Self-pay

## 2024-05-14 ENCOUNTER — Other Ambulatory Visit (HOSPITAL_COMMUNITY): Payer: Self-pay

## 2024-05-14 NOTE — Telephone Encounter (Signed)
 Pharmacy Patient Advocate Encounter  Insurance verification completed.   The patient is insured through [facility] [employer]   Ran test claim for Apretude. Currently a quantity of 3ml is a 30 day supply and the co-pay is will need to see insurance card, medication is covered under medical benefits but will need to check . Descovy was last filled on 08/20 , next fill date is 09/12  This test claim was processed through [facility]- copay amounts may vary at other pharmacies due to pharmacy/plan contracts, or as the patient moves through the different stages of their insurance plan.

## 2024-05-14 NOTE — Progress Notes (Unsigned)
 HPI: Richard Sloan is a 48 y.o. male who presents to the [facility] pharmacy clinic to discuss and initiate PrEP.  Insured   [x]    Uninsured  []    There are no active problems to display for this patient.   Patient's Medications  New Prescriptions   No medications on file  Previous Medications   ALPRAZOLAM (XANAX) 0.5 MG TABLET    Take 0.5 mg by mouth 2 (two) times daily as needed.   AMOXICILLIN -CLAVULANATE (AUGMENTIN ) 875-125 MG TABLET    Take 1 tablet by mouth 2 (two) times daily.   ATORVASTATIN (LIPITOR) 20 MG TABLET    Take 1 tablet by mouth daily.   BENZONATATE  (TESSALON ) 100 MG CAPSULE    Take 1 capsule (100 mg total) by mouth every 8 (eight) hours as needed for cough.   BREXPIPRAZOLE (REXULTI) 4 MG TABS    Take by mouth.   BUPROPION (WELLBUTRIN XL) 300 MG 24 HR TABLET    Take by mouth.   CARIPRAZINE (VRAYLAR) 1.5 MG CAPSULE    Take by mouth.   CEPHALEXIN  (KEFLEX ) 500 MG CAPSULE    Take 1 capsule (500 mg total) by mouth 4 (four) times daily.   DICYCLOMINE (BENTYL) 10 MG CAPSULE       EMTRICITABINE-TENOFOVIR AF (DESCOVY) 200-25 MG TABLET    TAKE ONE TABLET BY MOUTH ONCE DAILY WITH OR WITHOUT FOOD.   IMIQUIMOD (ALDARA) 5 % CREAM    Apply topically.   K PHOS MONO-SOD PHOS DI & MONO (PHOSPHA 250 NEUTRAL) [phone] MG TABS    Take by mouth.   MOMETASONE (NASONEX) 50 MCG/ACT NASAL SPRAY    2 sprays by Both Nostrils route daily.   MULTIPLE VITAMIN (MULTIVITAMIN) CAPSULE    Take 1 capsule by mouth daily.   OLOPATADINE  (PATADAY ) 0.1 % OPHTHALMIC SOLUTION    Place 1 drop into both eyes 2 (two) times daily.   OMEGA-3 FATTY ACIDS (FISH OIL) 1000 MG CAPS    Take by mouth.   ONDANSETRON  (ZOFRAN ) 4 MG TABLET    Take 1 tablet (4 mg total) by mouth every 6 (six) hours.   PANCREAZE [phone] UNITS CPEP    Take by mouth.   PRAZOSIN (MINIPRESS) 2 MG CAPSULE    Take 2 mg by mouth 2 (two) times daily.   PREDNISONE  (DELTASONE ) 20 MG TABLET    Take 2 tablets daily with breakfast.   QUETIAPINE  (SEROQUEL) 300 MG TABLET    Take by mouth.   TOPIRAMATE (TOPAMAX) 200 MG TABLET    Take 1 tablet by mouth daily.   TRETINOIN (RETIN-A) 0.05 % CREAM    Apply topically at bedtime.   TRINTELLIX 20 MG TABS TABLET    Take 20 mg by mouth daily.  Modified Medications   No medications on file  Discontinued Medications   No medications on file       05/15/2024    1:28 PM  [facility] HIV PREP FLOWSHEET RESULTS  Gender at birth Male  Risk for HIV In sexual relationship with HIV+ partner;Condomless vaginal or anal intercourse;Hx of STI  Sex Partners Men only  # sex partners past 3-6 mos 2  Sex activity preferences Insertive and receptive;Oral  Condom use No  Treated for STI? Yes  PrEP Eligibility Yes    Labs:  SCr: No results found for: CREATININE HIV No results found for: HIV Hepatitis B No results found for: HEPBSAB, HEPBSAG, HEPBCAB Hepatitis C No results found for: HEPCAB, HCVRNAPCRQN Hepatitis A No results  found for: HAV RPR and STI No results found for: LABRPR, RPRTITER      No data to display          Assessment: Richard Sloan presents today to discuss PrEP. He would like to transition from Descovy to Apretude. Partner is HIV + but undetectable. He has had no issues with Descovy but would like to avoid having to take a pill daily. Defers STI testing today. His injection was approved by insurance and he is eligible to start once we have his labs. Will collect baseline labs and HIV RNA today. Counseled patient to continue his Descovy and take his last dose on the day of his first injection. Discussed that this is a gluteal injection that he will get every 2 months. We talked about the initial loading period which requires him to get an injection every month for the first two injections. Patient is aware and agreeable to start. Counseled the patient on potential side effects include injection site soreness.   Number of partners in last 3 months: 2 Partner preference:  men only Sexual practices: insertive, receptive, and oral Use of prevention/protection: does not use condoms  STD history: gonorrhea (07/26/23) History of IVDU? no History of PEP? no  Plan: - Check HIV RNA - Lipid panel - CMP - Hepatitis A antibody - Hepatitis B core antibody, surface antigen, surface antibody - Follow up for first injection with Richard Sloan 05/30/24   Richard Sloan, PharmD PGY1 Clinical Pharmacist [facility]  05/15/2024 1:30 PM

## 2024-05-15 ENCOUNTER — Other Ambulatory Visit: Payer: Self-pay

## 2024-05-15 ENCOUNTER — Ambulatory Visit (INDEPENDENT_AMBULATORY_CARE_PROVIDER_SITE_OTHER): Admitting: Pharmacist

## 2024-05-15 ENCOUNTER — Telehealth: Payer: Self-pay

## 2024-05-15 ENCOUNTER — Other Ambulatory Visit (HOSPITAL_COMMUNITY): Payer: Self-pay

## 2024-05-15 DIAGNOSIS — Z113 Encounter for screening for infections with a predominantly sexual mode of transmission: Secondary | ICD-10-CM

## 2024-05-15 DIAGNOSIS — Z2981 Encounter for HIV pre-exposure prophylaxis: Secondary | ICD-10-CM | POA: Diagnosis not present

## 2024-05-15 NOTE — Progress Notes (Signed)
 NEW REFERRAL TO [facility]

## 2024-05-15 NOTE — Telephone Encounter (Signed)
 Pharmacy Patient Advocate Encounter- Apretude BIV-Medical Benefit:  J code: [id number] CPT code: 03627  Dx Code: Z72.53  NO PA is required through [employer]BS [place]  for prevention 100% covered no deductible Authorization# [id number]   Patient is enrolled in [employer] Portal

## 2024-05-17 LAB — HEPATITIS C ANTIBODY: Hepatitis C Ab: NONREACTIVE

## 2024-05-17 LAB — LIPID PANEL
Cholesterol: 144 mg/dL (ref <200)
HDL: 56 mg/dL (ref >=40)
LDL Cholesterol (Calc): 58 mg/dL
Non-HDL Cholesterol (Calc): 88 mg/dL (ref <130)
Total CHOL/HDL Ratio: 2.6 (calc) (ref <5.0)
Triglycerides: 237 mg/dL — ABNORMAL HIGH (ref <150)

## 2024-05-17 LAB — COMPREHENSIVE METABOLIC PANEL WITH GFR
AG Ratio: 1.8 (calc) (ref 1.0–2.5)
ALT: 29 U/L (ref 9–46)
AST: 21 U/L (ref 10–35)
Albumin: 4.9 g/dL (ref 3.6–5.1)
Alkaline phosphatase (APISO): 58 U/L (ref 35–144)
BUN/Creatinine Ratio: 8 (calc) (ref 6–22)
BUN: 11 mg/dL (ref 7–25)
CO2: 30 mmol/L (ref 20–32)
Calcium: 9.7 mg/dL (ref 8.6–10.3)
Chloride: 105 mmol/L (ref 98–110)
Creat: 1.32 mg/dL — ABNORMAL HIGH (ref 0.70–1.30)
Globulin: 2.8 g/dL (ref 1.9–3.7)
Glucose, Bld: 82 mg/dL (ref 65–99)
Potassium: 4.2 mmol/L (ref 3.5–5.3)
Sodium: 142 mmol/L (ref 135–146)
Total Bilirubin: 0.9 mg/dL (ref 0.2–1.2)
Total Protein: 7.7 g/dL (ref 6.1–8.1)
eGFR: 66 mL/min/1.73m2 (ref >=60)

## 2024-05-17 LAB — HEPATITIS B SURFACE ANTIBODY,QUALITATIVE: Hep B S Ab: REACTIVE — AB

## 2024-05-17 LAB — HEPATITIS B CORE ANTIBODY, TOTAL: Hep B Core Total Ab: REACTIVE — AB

## 2024-05-17 LAB — HEPATITIS B SURFACE ANTIGEN: Hepatitis B Surface Ag: NONREACTIVE

## 2024-05-17 LAB — HIV-1 RNA QUANT-NO REFLEX-BLD
HIV 1 RNA Quant: NOT DETECTED {copies}/mL
HIV-1 RNA Quant, Log: NOT DETECTED {Log_copies}/mL

## 2024-05-17 LAB — HEPATITIS A ANTIBODY, TOTAL: Hepatitis A AB,Total: REACTIVE — AB

## 2024-05-29 NOTE — Progress Notes (Unsigned)
 HPI: Richard Sloan is a 48 y.o. male who presents to the [facility] clinic for Apretude  administration and HIV PrEP follow up.  Insured   [x]    Uninsured  []    There are no active problems to display for this patient.   Patient's Medications  New Prescriptions   No medications on file  Previous Medications   ALPRAZOLAM (XANAX) 0.5 MG TABLET    Take 0.5 mg by mouth 2 (two) times daily as needed.   AMOXICILLIN -CLAVULANATE (AUGMENTIN ) 875-125 MG TABLET    Take 1 tablet by mouth 2 (two) times daily.   ATORVASTATIN (LIPITOR) 20 MG TABLET    Take 1 tablet by mouth daily.   BENZONATATE  (TESSALON ) 100 MG CAPSULE    Take 1 capsule (100 mg total) by mouth every 8 (eight) hours as needed for cough.   BREXPIPRAZOLE (REXULTI) 4 MG TABS    Take by mouth.   BUPROPION (WELLBUTRIN XL) 300 MG 24 HR TABLET    Take by mouth.   CARIPRAZINE (VRAYLAR) 1.5 MG CAPSULE    Take by mouth.   CEPHALEXIN  (KEFLEX ) 500 MG CAPSULE    Take 1 capsule (500 mg total) by mouth 4 (four) times daily.   DICYCLOMINE (BENTYL) 10 MG CAPSULE       EMTRICITABINE-TENOFOVIR AF (DESCOVY) 200-25 MG TABLET    TAKE ONE TABLET BY MOUTH ONCE DAILY WITH OR WITHOUT FOOD.   IMIQUIMOD (ALDARA) 5 % CREAM    Apply topically.   K PHOS MONO-SOD PHOS DI & MONO (PHOSPHA 250 NEUTRAL) 155-852-130 MG TABS    Take by mouth.   MOMETASONE (NASONEX) 50 MCG/ACT NASAL SPRAY    2 sprays by Both Nostrils route daily.   MULTIPLE VITAMIN (MULTIVITAMIN) CAPSULE    Take 1 capsule by mouth daily.   OLOPATADINE  (PATADAY ) 0.1 % OPHTHALMIC SOLUTION    Place 1 drop into both eyes 2 (two) times daily.   OMEGA-3 FATTY ACIDS (FISH OIL) 1000 MG CAPS    Take by mouth.   ONDANSETRON  (ZOFRAN ) 4 MG TABLET    Take 1 tablet (4 mg total) by mouth every 6 (six) hours.   PANCREAZE 83199-43199 UNITS CPEP    Take by mouth.   PRAZOSIN (MINIPRESS) 2 MG CAPSULE    Take 2 mg by mouth 2 (two) times daily.   PREDNISONE  (DELTASONE ) 20 MG TABLET    Take 2 tablets daily with  breakfast.   QUETIAPINE (SEROQUEL) 300 MG TABLET    Take by mouth.   TOPIRAMATE (TOPAMAX) 200 MG TABLET    Take 1 tablet by mouth daily.   TRETINOIN (RETIN-A) 0.05 % CREAM    Apply topically at bedtime.   TRINTELLIX 20 MG TABS TABLET    Take 20 mg by mouth daily.  Modified Medications   No medications on file  Discontinued Medications   No medications on file    Allergies: No Known Allergies  Labs: Lab Results  Component Value Date   HIV1RNAQUANT NOT DETECTED 05/15/2024    RPR and STI No results found for: LABRPR, RPRTITER      No data to display          Hepatitis B Lab Results  Component Value Date   HEPBSAB REACTIVE (A) 05/15/2024   HEPBSAG NON-REACTIVE 05/15/2024   HEPBCAB REACTIVE (A) 05/15/2024   Hepatitis C Lab Results  Component Value Date   HEPCAB NON-REACTIVE 05/15/2024   Hepatitis A Lab Results  Component Value Date   HAV REACTIVE (A) 05/15/2024  Lipids: Lab Results  Component Value Date   CHOL 144 05/15/2024   TRIG 237 (H) 05/15/2024   HDL 56 05/15/2024   CHOLHDL 2.6 05/15/2024   LDLCALC 58 05/15/2024    Current PrEP Regimen: Transitioned from Emtricitabine-tenofovir AF (Descovy) 200-25 mg tablet to cabotegravir  (Apretude ) 600mg /89mL  TARGET DATE: The 10th of the month (window 2nd-17th of the month)  Assessment: [name] presents today for their first initiation injection of Apretude  and to follow up for HIV PrEP. Transitioning from Descovy to Apretude ; partner is HIV+ but undetectable. He reports medication adherence and will take his last dose today. Declined STI testing and deferred flu vaccine to next appointment. He also deferred his zoster vaccine stating that his insurance no longer will cover it for patients <=93 years old.  Counseled that Apretude  is one intramuscular injection in the gluteal muscle for each visit. Explained that the second injection is 30 days after the initial injection then every 2 months thereafter. Discussed  follow up appointments moving forward. Screened for acute HIV symptoms such as fatigue, muscle aches, rash, sore throat, lymphadenopathy, headache, night sweats, nausea/vomiting/diarrhea, and fever. Denies any symptoms.   Explained that showing up to injection appointments is very important and warned that if appointments are missed, protection will be minimal and the risk of acquiring HIV becomes much higher. Counseled on possible side effects associated with the injections such as injection site pain, which is usually mild to moderate in nature, injection site nodules, and injection site reactions. Asked to call the clinic or send me a mychart message if they experience any issues. Advised that he can take Motrin or Tylenol for injection site pain if needed. He may also pre-treat with Motrin or Tylenol 30-45 minutes before scheduled appointments.   Per [employer] guidelines, a rapid HIV test should be drawn prior to Apretude  administration. Due to state shortage of rapid HIV tests, this is temporarily unable to be done. Per decision from [facility] physicians, we will proceed with Apretude  administration at this time without a negative rapid HIV test beforehand. HIV RNA was collected today and is in process.  Administered cabotegravir  600mg /72mL in right upper outer quadrant of the gluteal muscle. Monitored patient for 10 minutes after injection. Injection was tolerated well without issue. Counseled to stop taking Descovy after today's dose and to call with any issues that may arise. Will make follow up appointments for second initiation injection in 30 days and then maintenance injections every 2 months thereafter.    Plan: - Stop Descovy 200-25 mg tablet - First Apretude  injection administered - Second initiation injection scheduled for 10/7 - Maintenance injection scheduled for 12/3 - HIV RNA testing today - Deferred flu vaccine to next visit - Call with any issues or questions  Comer Right -  PharmD Candidate

## 2024-05-30 ENCOUNTER — Other Ambulatory Visit: Payer: Self-pay

## 2024-05-30 ENCOUNTER — Ambulatory Visit (INDEPENDENT_AMBULATORY_CARE_PROVIDER_SITE_OTHER): Payer: Self-pay | Admitting: Pharmacist

## 2024-05-30 DIAGNOSIS — Z2981 Encounter for HIV pre-exposure prophylaxis: Secondary | ICD-10-CM

## 2024-05-30 MED ORDER — CABOTEGRAVIR ER 600 MG/3ML IM SUER
600.0000 mg | Freq: Once | INTRAMUSCULAR | Status: AC
Start: 2024-05-30 — End: 2024-05-30
  Administered 2024-05-30: 600 mg via INTRAMUSCULAR

## 2024-06-01 LAB — HIV-1 RNA QUANT-NO REFLEX-BLD
HIV 1 RNA Quant: NOT DETECTED {copies}/mL
HIV-1 RNA Quant, Log: NOT DETECTED {Log_copies}/mL

## 2024-06-25 NOTE — Progress Notes (Unsigned)
 HPI: Richard Sloan is a 48 y.o. male who presents to the [facility] clinic for Apretude  administration and HIV PrEP follow up.  Referring ID Physician: Dr. Fleeta Rothman  There are no active problems to display for this patient.   Patient's Medications  New Prescriptions   No medications on file  Previous Medications   ALPRAZOLAM (XANAX) 0.5 MG TABLET    Take 0.5 mg by mouth 2 (two) times daily as needed.   AMOXICILLIN -CLAVULANATE (AUGMENTIN ) 875-125 MG TABLET    Take 1 tablet by mouth 2 (two) times daily.   ATORVASTATIN (LIPITOR) 20 MG TABLET    Take 1 tablet by mouth daily.   BENZONATATE  (TESSALON ) 100 MG CAPSULE    Take 1 capsule (100 mg total) by mouth every 8 (eight) hours as needed for cough.   BREXPIPRAZOLE (REXULTI) 4 MG TABS    Take by mouth.   BUPROPION (WELLBUTRIN XL) 300 MG 24 HR TABLET    Take by mouth.   CARIPRAZINE (VRAYLAR) 1.5 MG CAPSULE    Take by mouth.   CEPHALEXIN  (KEFLEX ) 500 MG CAPSULE    Take 1 capsule (500 mg total) by mouth 4 (four) times daily.   DICYCLOMINE (BENTYL) 10 MG CAPSULE       EMTRICITABINE-TENOFOVIR AF (DESCOVY) 200-25 MG TABLET    TAKE ONE TABLET BY MOUTH ONCE DAILY WITH OR WITHOUT FOOD.   IMIQUIMOD (ALDARA) 5 % CREAM    Apply topically.   K PHOS MONO-SOD PHOS DI & MONO (PHOSPHA 250 NEUTRAL) 155-852-130 MG TABS    Take by mouth.   MOMETASONE (NASONEX) 50 MCG/ACT NASAL SPRAY    2 sprays by Both Nostrils route daily.   MULTIPLE VITAMIN (MULTIVITAMIN) CAPSULE    Take 1 capsule by mouth daily.   OLOPATADINE  (PATADAY ) 0.1 % OPHTHALMIC SOLUTION    Place 1 drop into both eyes 2 (two) times daily.   OMEGA-3 FATTY ACIDS (FISH OIL) 1000 MG CAPS    Take by mouth.   ONDANSETRON  (ZOFRAN ) 4 MG TABLET    Take 1 tablet (4 mg total) by mouth every 6 (six) hours.   PANCREAZE 83199-43199 UNITS CPEP    Take by mouth.   PRAZOSIN (MINIPRESS) 2 MG CAPSULE    Take 2 mg by mouth 2 (two) times daily.   PREDNISONE  (DELTASONE ) 20 MG TABLET    Take 2 tablets daily with  breakfast.   QUETIAPINE (SEROQUEL) 300 MG TABLET    Take by mouth.   TOPIRAMATE (TOPAMAX) 200 MG TABLET    Take 1 tablet by mouth daily.   TRETINOIN (RETIN-A) 0.05 % CREAM    Apply topically at bedtime.   TRINTELLIX 20 MG TABS TABLET    Take 20 mg by mouth daily.  Modified Medications   No medications on file  Discontinued Medications   No medications on file    Allergies: No Known Allergies  Past Medical History: Past Medical History:  Diagnosis Date   Arthritis    Bipolar 1 disorder (HCC)    Chronic kidney disease    Depression    High cholesterol     Social History: Social History   Socioeconomic History   Marital status: Single    Spouse name: Not on file   Number of children: Not on file   Years of education: Not on file   Highest education level: Not on file  Occupational History   Not on file  Tobacco Use   Smoking status: Never   Smokeless tobacco:  Current    Types: Chew  Vaping Use   Vaping status: Never Used  Substance and Sexual Activity   Alcohol use: Yes    Comment: 12 pk a week   Drug use: Not Currently   Sexual activity: Not on file  Other Topics Concern   Not on file  Social History Narrative   Not on file   Social Drivers of Health   Financial Resource Strain: Low Risk  (11/25/2023)   Received from [facility]   Overall Financial Resource Strain (CARDIA)    Difficulty of Paying Living Expenses: Not hard at all  Food Insecurity: No Food Insecurity (11/25/2023)   Received from [facility]   Hunger Vital Sign    Within the past 12 months, you worried that your food would run out before you got the money to buy more.: Never true    Within the past 12 months, the food you bought just didn't last and you didn't have money to get more.: Never true  Transportation Needs: No Transportation Needs (11/25/2023)   Received from [facility] - Transportation    Lack of Transportation (Medical): No    Lack of Transportation (Non-Medical):  No  Physical Activity: Insufficiently Active (11/03/2022)   Received from [facility]   Exercise Vital Sign    On average, how many days per week do you engage in moderate to strenuous exercise (like a brisk walk)?: 1 day    On average, how many minutes do you engage in exercise at this level?: 60 min  Stress: Stress Concern Present (11/03/2022)   Received from [facility] - Occupational Stress Questionnaire    Feeling of Stress : To some extent  Social Connections: Moderately Integrated (11/03/2022)   Received from [facility]   Social Network    How would you rate your social network (family, work, friends)?: Adequate participation with social networks    Labs: Lab Results  Component Value Date   HIV1RNAQUANT NOT DETECTED 05/30/2024   HIV1RNAQUANT NOT DETECTED 05/15/2024    RPR and STI No results found for: LABRPR, RPRTITER      No data to display          Hepatitis B Lab Results  Component Value Date   HEPBSAB REACTIVE (A) 05/15/2024   HEPBSAG NON-REACTIVE 05/15/2024   HEPBCAB REACTIVE (A) 05/15/2024   Hepatitis C Lab Results  Component Value Date   HEPCAB NON-REACTIVE 05/15/2024   Hepatitis A Lab Results  Component Value Date   HAV REACTIVE (A) 05/15/2024   Lipids: Lab Results  Component Value Date   CHOL 144 05/15/2024   TRIG 237 (H) 05/15/2024   HDL 56 05/15/2024   CHOLHDL 2.6 05/15/2024   LDLCALC 58 05/15/2024    TARGET DATE: The 10th of the month  Assessment: Richard Sloan presents today for their Apretude  injection and to follow up for HIV PrEP. Only experienced soreness x 1-2 days following his first injection. Screened patient for acute HIV symptoms such as fatigue, muscle aches, rash, sore throat, lymphadenopathy, headache, night sweats, nausea/vomiting/diarrhea, and fever. Patient denies any symptoms.   Per [employer] guidelines, a rapid HIV test should be drawn prior to Apretude  administration.  Due to state shortage of rapid HIV tests, this is temporarily unable to be done. Per decision from [facility] physicians, we will proceed with Apretude  administration at this time without a negative rapid HIV test beforehand. HIV RNA was collected today and is in  process.  Administered cabotegravir  600mg /69mL in right upper outer quadrant of the gluteal muscle. Will make follow up appointments for maintenance injections every 2 months.   No known exposures to any STIs and no signs or symptoms of any STIs today.  No new partners since last injection; politely declines today.   Accepts annual flu and COVID vaccines today.   Plan: - Administer Apretude  600 mg x 1  - Maintenance injections scheduled for 12/3 with me  - Check HIV RNA - Administer flu and COVID vaccines  - Call with any issues or questions  Alan Geralds, PharmD, CPP, BCIDP, AAHIVP Clinical Pharmacist Practitioner Infectious Diseases Clinical Pharmacist [facility]

## 2024-06-26 ENCOUNTER — Other Ambulatory Visit: Payer: Self-pay

## 2024-06-26 ENCOUNTER — Ambulatory Visit (INDEPENDENT_AMBULATORY_CARE_PROVIDER_SITE_OTHER): Admitting: Pharmacist

## 2024-06-26 DIAGNOSIS — Z2981 Encounter for HIV pre-exposure prophylaxis: Secondary | ICD-10-CM

## 2024-06-26 DIAGNOSIS — Z23 Encounter for immunization: Secondary | ICD-10-CM | POA: Diagnosis not present

## 2024-06-26 MED ORDER — CABOTEGRAVIR ER 600 MG/3ML IM SUER
600.0000 mg | Freq: Once | INTRAMUSCULAR | Status: AC
Start: 2024-06-26 — End: 2024-06-26
  Administered 2024-06-26: 600 mg via INTRAMUSCULAR

## 2024-06-28 LAB — HIV-1 RNA QUANT-NO REFLEX-BLD
HIV 1 RNA Quant: NOT DETECTED {copies}/mL
HIV-1 RNA Quant, Log: NOT DETECTED {Log_copies}/mL

## 2024-08-22 ENCOUNTER — Ambulatory Visit: Admitting: Pharmacist

## 2024-08-27 NOTE — Progress Notes (Unsigned)
 HPI: Richard Sloan is a 48 y.o. male who presents to the [facility] pharmacy clinic for Apretude  administration and HIV PrEP follow up.  Insured   [x]    Uninsured  []    Referring ID Physician: Dr. Fleeta Rothman  There are no active problems to display for this patient.   Patient's Medications  New Prescriptions   No medications on file  Previous Medications   ALPRAZOLAM (XANAX) 0.5 MG TABLET    Take 0.5 mg by mouth 2 (two) times daily as needed.   AMOXICILLIN -CLAVULANATE (AUGMENTIN ) 875-125 MG TABLET    Take 1 tablet by mouth 2 (two) times daily.   ATORVASTATIN (LIPITOR) 20 MG TABLET    Take 1 tablet by mouth daily.   BENZONATATE  (TESSALON ) 100 MG CAPSULE    Take 1 capsule (100 mg total) by mouth every 8 (eight) hours as needed for cough.   BREXPIPRAZOLE (REXULTI) 4 MG TABS    Take by mouth.   BUPROPION (WELLBUTRIN XL) 300 MG 24 HR TABLET    Take by mouth.   CARIPRAZINE (VRAYLAR) 1.5 MG CAPSULE    Take by mouth.   CEPHALEXIN  (KEFLEX ) 500 MG CAPSULE    Take 1 capsule (500 mg total) by mouth 4 (four) times daily.   DICYCLOMINE (BENTYL) 10 MG CAPSULE       EMTRICITABINE-TENOFOVIR AF (DESCOVY) 200-25 MG TABLET    TAKE ONE TABLET BY MOUTH ONCE DAILY WITH OR WITHOUT FOOD.   IMIQUIMOD (ALDARA) 5 % CREAM    Apply topically.   K PHOS MONO-SOD PHOS DI & MONO (PHOSPHA 250 NEUTRAL) 155-852-130 MG TABS    Take by mouth.   MOMETASONE (NASONEX) 50 MCG/ACT NASAL SPRAY    2 sprays by Both Nostrils route daily.   MULTIPLE VITAMIN (MULTIVITAMIN) CAPSULE    Take 1 capsule by mouth daily.   OLOPATADINE  (PATADAY ) 0.1 % OPHTHALMIC SOLUTION    Place 1 drop into both eyes 2 (two) times daily.   OMEGA-3 FATTY ACIDS (FISH OIL) 1000 MG CAPS    Take by mouth.   ONDANSETRON  (ZOFRAN ) 4 MG TABLET    Take 1 tablet (4 mg total) by mouth every 6 (six) hours.   PANCREAZE 83199-43199 UNITS CPEP    Take by mouth.   PRAZOSIN (MINIPRESS) 2 MG CAPSULE    Take 2 mg by mouth 2 (two) times daily.   PREDNISONE  (DELTASONE ) 20 MG  TABLET    Take 2 tablets daily with breakfast.   QUETIAPINE (SEROQUEL) 300 MG TABLET    Take by mouth.   TOPIRAMATE (TOPAMAX) 200 MG TABLET    Take 1 tablet by mouth daily.   TRETINOIN (RETIN-A) 0.05 % CREAM    Apply topically at bedtime.   TRINTELLIX 20 MG TABS TABLET    Take 20 mg by mouth daily.  Modified Medications   No medications on file  Discontinued Medications   No medications on file    Allergies: No Known Allergies  Past Medical History: Past Medical History:  Diagnosis Date   Arthritis    Bipolar 1 disorder (HCC)    Chronic kidney disease    Depression    High cholesterol     Social History: Social History   Socioeconomic History   Marital status: Single    Spouse name: Not on file   Number of children: Not on file   Years of education: Not on file   Highest education level: Not on file  Occupational History   Not on file  Tobacco Use   Smoking status: Never   Smokeless tobacco: Current    Types: Chew  Vaping Use   Vaping status: Never Used  Substance and Sexual Activity   Alcohol use: Yes    Comment: 12 pk a week   Drug use: Not Currently   Sexual activity: Not on file  Other Topics Concern   Not on file  Social History Narrative   Not on file   Social Drivers of Health   Financial Resource Strain: Low Risk (11/25/2023)   Received from [facility]   Overall Financial Resource Strain (CARDIA)    Difficulty of Paying Living Expenses: Not hard at all  Food Insecurity: No Food Insecurity (11/25/2023)   Received from [facility]   Hunger Vital Sign    Within the past 12 months, you worried that your food would run out before you got the money to buy more.: Never true    Within the past 12 months, the food you bought just didn't last and you didn't have money to get more.: Never true  Transportation Needs: No Transportation Needs (11/25/2023)   Received from [facility] - Transportation    Lack of Transportation (Medical): No    Lack  of Transportation (Non-Medical): No  Physical Activity: Insufficiently Active (11/03/2022)   Received from [facility]   Exercise Vital Sign    On average, how many days per week do you engage in moderate to strenuous exercise (like a brisk walk)?: 1 day    On average, how many minutes do you engage in exercise at this level?: 60 min  Stress: Stress Concern Present (11/03/2022)   Received from [facility] - Occupational Stress Questionnaire    Feeling of Stress : To some extent  Social Connections: Moderately Integrated (11/03/2022)   Received from [facility]   Social Network    How would you rate your social network (family, work, friends)?: Adequate participation with social networks    Labs: Lab Results  Component Value Date   HIV1RNAQUANT NOT DETECTED 06/26/2024   HIV1RNAQUANT NOT DETECTED 05/30/2024   HIV1RNAQUANT NOT DETECTED 05/15/2024    RPR and STI No results found for: LABRPR, RPRTITER      No data to display          Hepatitis B Lab Results  Component Value Date   HEPBSAB REACTIVE (A) 05/15/2024   HEPBSAG NON-REACTIVE 05/15/2024   HEPBCAB REACTIVE (A) 05/15/2024   Hepatitis C Lab Results  Component Value Date   HEPCAB NON-REACTIVE 05/15/2024   Hepatitis A Lab Results  Component Value Date   HAV REACTIVE (A) 05/15/2024   Lipids: Lab Results  Component Value Date   CHOL 144 05/15/2024   TRIG 237 (H) 05/15/2024   HDL 56 05/15/2024   CHOLHDL 2.6 05/15/2024   LDLCALC 58 05/15/2024    TARGET DATE: The 10th of the month  Assessment: Richard Sloan presents today for their Apretude  injection and to follow up for HIV PrEP. No issues with past injections.  Screened patient for acute HIV symptoms such as fatigue, muscle aches, rash, sore throat, lymphadenopathy, headache, night sweats, nausea/vomiting/diarrhea, and fever. Patient denies any symptoms.   Administered cabotegravir  600mg /57mL in *** upper outer  quadrant of the gluteal muscle. Will make follow up appointments for maintenance injections every 2 months.   No known exposures to any STIs and no signs or symptoms of any STIs today. *** new partners since last Injection; will  check *** today. Due for flu vaccine which he defers. Insurance will not cover Shingles vaccine for him until he is 48 years old.   Discussed Yeztugo for PrEP. Counseled patient that they will need to complete an oral loading dose. Counseled that patient will take two Sunlenca 300 mg tablets (600 mg total) orally on the first day of their injection and will take two Sunlenca 300 mg tablets (600 mg total) orally the next day regardless of meals. Counseled patient that Sunlenca is two separate subcutaneous injections in the abdomen every 6 months. Reviewed that the main side effects are injection-site soreness and nodules. Discussed measures for relief including cold packs and over-the-counter anti-inflammatories. ***  Plan: - Administer Apretude  600 mg x 1  - Maintenance injections scheduled for 2/3 and 4/3 with me  - Check HIV RNA and *** - Call with any issues or questions  Alan Geralds, PharmD, CPP, BCIDP, AAHIVP Clinical Pharmacist Practitioner Infectious Diseases Clinical Pharmacist [facility]

## 2024-08-30 ENCOUNTER — Other Ambulatory Visit (HOSPITAL_COMMUNITY): Payer: Self-pay

## 2024-08-30 ENCOUNTER — Ambulatory Visit: Admitting: Pharmacist

## 2024-08-30 ENCOUNTER — Other Ambulatory Visit: Payer: Self-pay

## 2024-08-30 DIAGNOSIS — Z79899 Other long term (current) drug therapy: Secondary | ICD-10-CM | POA: Diagnosis not present

## 2024-08-30 MED ADMIN — Cabotegravir IM Extended Release Susp 600 MG/3ML: 600 mg | INTRAMUSCULAR | NDC 49702026423

## 2024-08-30 NOTE — Patient Instructions (Signed)
 New PrEP Medication to Research = Yeztugo  - Two 1.5 mL subcutaneous injections - Administered in clinic every 6 months - Must take loading doses (2 pills each day) on day 1 and day 2 after first injection  - Main side effect is injection site pain, soreness, and nodules  - Nodules can last for months but tend to improve after each injection  - We still need to check an HIV test at every visit  Feel free to call if you have any questions before your next visit [phone])!    Alan Geralds, PharmD, CPP, BCIDP, AAHIVP Clinical Pharmacist Practitioner Infectious Diseases Clinical Pharmacist [facility]

## 2024-09-01 LAB — HIV-1 RNA QUANT-NO REFLEX-BLD
HIV 1 RNA Quant: NOT DETECTED {copies}/mL
HIV-1 RNA Quant, Log: NOT DETECTED {Log_copies}/mL

## 2024-10-15 ENCOUNTER — Other Ambulatory Visit (HOSPITAL_COMMUNITY): Payer: Self-pay

## 2024-10-19 ENCOUNTER — Other Ambulatory Visit (HOSPITAL_COMMUNITY): Payer: Self-pay

## 2024-10-19 ENCOUNTER — Other Ambulatory Visit: Payer: Self-pay | Admitting: Pharmacist

## 2024-10-19 DIAGNOSIS — Z79899 Other long term (current) drug therapy: Secondary | ICD-10-CM

## 2024-10-19 MED ORDER — YEZTUGO 463.5 MG/1.5ML ~~LOC~~ SOLN: 927.0000 mg | SUBCUTANEOUS | 1 refills | Status: AC

## 2024-10-19 MED ORDER — YEZTUGO 300 MG PO TABS: 600.0000 mg | ORAL_TABLET | Freq: Every day | ORAL | 0 refills | Status: AC

## 2024-10-19 NOTE — Progress Notes (Unsigned)
 "  HPI: Richard Sloan is a 49 y.o. male who presents to the [facility] pharmacy clinic for Apretude  administration and HIV PrEP follow up.  Insured   [x]    Uninsured  []    Referring ID Physician: Corean Fireman, NP   There are no active problems to display for this patient.   Patient's Medications  New Prescriptions   No medications on file  Previous Medications   ALPRAZOLAM (XANAX) 0.5 MG TABLET    Take 0.5 mg by mouth 2 (two) times daily as needed.   AMOXICILLIN -CLAVULANATE (AUGMENTIN ) 875-125 MG TABLET    Take 1 tablet by mouth 2 (two) times daily.   ATORVASTATIN (LIPITOR) 20 MG TABLET    Take 1 tablet by mouth daily.   BENZONATATE  (TESSALON ) 100 MG CAPSULE    Take 1 capsule (100 mg total) by mouth every 8 (eight) hours as needed for cough.   BREXPIPRAZOLE (REXULTI) 4 MG TABS    Take by mouth.   BUPROPION (WELLBUTRIN XL) 300 MG 24 HR TABLET    Take by mouth.   CARIPRAZINE (VRAYLAR) 1.5 MG CAPSULE    Take by mouth.   CEPHALEXIN  (KEFLEX ) 500 MG CAPSULE    Take 1 capsule (500 mg total) by mouth 4 (four) times daily.   DICYCLOMINE (BENTYL) 10 MG CAPSULE       EMTRICITABINE-TENOFOVIR AF (DESCOVY) 200-25 MG TABLET    TAKE ONE TABLET BY MOUTH ONCE DAILY WITH OR WITHOUT FOOD.   IMIQUIMOD (ALDARA) 5 % CREAM    Apply topically.   K PHOS MONO-SOD PHOS DI & MONO (PHOSPHA 250 NEUTRAL) 155-852-130 MG TABS    Take by mouth.   MOMETASONE (NASONEX) 50 MCG/ACT NASAL SPRAY    2 sprays by Both Nostrils route daily.   MULTIPLE VITAMIN (MULTIVITAMIN) CAPSULE    Take 1 capsule by mouth daily.   OLOPATADINE  (PATADAY ) 0.1 % OPHTHALMIC SOLUTION    Place 1 drop into both eyes 2 (two) times daily.   OMEGA-3 FATTY ACIDS (FISH OIL) 1000 MG CAPS    Take by mouth.   ONDANSETRON  (ZOFRAN ) 4 MG TABLET    Take 1 tablet (4 mg total) by mouth every 6 (six) hours.   PANCREAZE 83199-43199 UNITS CPEP    Take by mouth.   PRAZOSIN (MINIPRESS) 2 MG CAPSULE    Take 2 mg by mouth 2 (two) times daily.   PREDNISONE  (DELTASONE )  20 MG TABLET    Take 2 tablets daily with breakfast.   QUETIAPINE (SEROQUEL) 300 MG TABLET    Take by mouth.   TOPIRAMATE (TOPAMAX) 200 MG TABLET    Take 1 tablet by mouth daily.   TRETINOIN (RETIN-A) 0.05 % CREAM    Apply topically at bedtime.   TRINTELLIX 20 MG TABS TABLET    Take 20 mg by mouth daily.  Modified Medications   No medications on file  Discontinued Medications   No medications on file    Allergies: Allergies[1]  Past Medical History: Past Medical History:  Diagnosis Date   Arthritis    Bipolar 1 disorder (HCC)    Chronic kidney disease    Depression    High cholesterol     Social History: Social History   Socioeconomic History   Marital status: Single    Spouse name: Not on file   Number of children: Not on file   Years of education: Not on file   Highest education level: Not on file  Occupational History   Not on file  Tobacco Use   Smoking status: Never   Smokeless tobacco: Current    Types: Chew  Vaping Use   Vaping status: Never Used  Substance and Sexual Activity   Alcohol use: Yes    Comment: 12 pk a week   Drug use: Not Currently   Sexual activity: Not on file  Other Topics Concern   Not on file  Social History Narrative   Not on file   Social Drivers of Health   Tobacco Use: High Risk (10/18/2024)   Received from [facility]   Patient History    Smoking Tobacco Use: Never    Smokeless Tobacco Use: Current    Passive Exposure: Past  Financial Resource Strain: Low Risk (10/18/2024)   Received from [facility]   Overall Financial Resource Strain (CARDIA)    How hard is it for you to pay for the very basics like food, housing, medical care, and heating?: Not hard at all  Food Insecurity: No Food Insecurity (10/18/2024)   [facility]   Epic    Within the past 12 months, you worried that your food would run out before you got the money to buy more.: Never true    Within the past 12 months, the food you bought just  didn't last and you didn't have money to get more.: Never true  Transportation Needs: No Transportation Needs (10/18/2024)   Received from [facility]    In the past 12 months, has lack of transportation kept you from medical appointments or from getting medications?: No    In the past 12 months, has lack of transportation kept you from meetings, work, or from getting things needed for daily living?: No  Physical Activity: Insufficiently Active (11/03/2022)   [facility]   Exercise Vital Sign    On average, how many days per week do you engage in moderate to strenuous exercise (like a brisk walk)?: 1 day    On average, how many minutes do you engage in exercise at this level?: 60 min  Stress: Stress Concern Present (11/03/2022)   Received from [facility] - Occupational Stress Questionnaire    Feeling of Stress : To some extent  Social Connections: Moderately Integrated (11/03/2022)   Received from [facility]   Social Network    How would you rate your social network (family, work, friends)?: Adequate participation with social networks  Depression (PHQ2-9): Not on file  Alcohol Screen: Not on file  Housing: Low Risk (10/18/2024)   Received from [facility]    In the last 12 months, was there a time when you were not able to pay the mortgage or rent on time?: No    In the past 12 months, how many times have you moved where you were living?: 0    At any time in the past 12 months, were you homeless or living in a shelter (including now)?: No  Utilities: Not At Risk (10/18/2024)   Received from [facility]    In the past 12 months has the electric, gas, oil, or water company threatened to shut off services in your home?: No  Health Literacy: Not on file    Labs: Lab Results  Component Value Date   HIV1RNAQUANT NOT DETECTED 08/30/2024   HIV1RNAQUANT NOT DETECTED 06/26/2024   HIV1RNAQUANT NOT  DETECTED 05/30/2024    RPR and STI No results found for:  LABRPR, RPRTITER      No data to display          Hepatitis B Lab Results  Component Value Date   HEPBSAB REACTIVE (A) 05/15/2024   HEPBSAG NON-REACTIVE 05/15/2024   HEPBCAB REACTIVE (A) 05/15/2024   Hepatitis C Lab Results  Component Value Date   HEPCAB NON-REACTIVE 05/15/2024   Hepatitis A Lab Results  Component Value Date   HAV REACTIVE (A) 05/15/2024   Lipids: Lab Results  Component Value Date   CHOL 144 05/15/2024   TRIG 237 (H) 05/15/2024   HDL 56 05/15/2024   CHOLHDL 2.6 05/15/2024   LDLCALC 58 05/15/2024    TARGET DATE: The 10th of the month  Assessment: Richard Sloan presents today for their Apretude  injection and to follow up for HIV PrEP. No issues with past injections.  Screened patient for acute HIV symptoms such as fatigue, muscle aches, rash, sore throat, lymphadenopathy, headache, night sweats, nausea/vomiting/diarrhea, and fever. Patient denies any symptoms.   Administered cabotegravir  600mg /40mL in *** upper outer quadrant of the gluteal muscle. Will make follow up appointments for maintenance injections every 2 months.   No known exposures to any STIs and no signs or symptoms of any STIs today. No new partners since last injection; politely declines STI testing today.   Patient remains interested in Richard Sloan ; will send prescription to [facility] pharmacy and start in April as long as PA approved. Of note, there are a couple potential drug interactions with Richard Sloan  and his maintenance medications. Richard Sloan  may increase Vraylar concentrations. Based on his daily dose of 6 mg, would need to decrease dosing to 1.5 mg once daily. Richard Sloan  may also increase concentrations of atorvastatin; would need to monitor for potential atorvastatin toxicity.   Plan: - Administer Apretude  600 mg x 1  - Start Richard Sloan  on 4/3 with me  - Discuss potential Vraylar dosing adjustment with *** provider before starting Richard Sloan    - Check HIV RNA  - Call with any issues or questions  Alan Geralds, PharmD, CPP, BCIDP, AAHIVP Clinical Pharmacist Practitioner Infectious Diseases Clinical Pharmacist [facility]     [1] No Known Allergies  "

## 2024-10-22 ENCOUNTER — Encounter: Payer: Self-pay | Admitting: Pharmacist

## 2024-10-23 ENCOUNTER — Ambulatory Visit: Admitting: Pharmacist

## 2024-10-23 ENCOUNTER — Other Ambulatory Visit (HOSPITAL_COMMUNITY): Payer: Self-pay

## 2024-10-23 DIAGNOSIS — Z79899 Other long term (current) drug therapy: Secondary | ICD-10-CM

## 2024-10-24 ENCOUNTER — Telehealth: Payer: Self-pay | Admitting: Pharmacist

## 2024-10-24 ENCOUNTER — Ambulatory Visit: Admitting: Pharmacist

## 2024-10-24 ENCOUNTER — Other Ambulatory Visit: Payer: Self-pay

## 2024-10-24 ENCOUNTER — Telehealth: Payer: Self-pay

## 2024-10-24 DIAGNOSIS — Z2981 Encounter for HIV pre-exposure prophylaxis: Secondary | ICD-10-CM | POA: Diagnosis not present

## 2024-10-24 DIAGNOSIS — Z79899 Other long term (current) drug therapy: Secondary | ICD-10-CM

## 2024-10-24 MED ORDER — CABOTEGRAVIR ER 600 MG/3ML IM SUER
600.0000 mg | Freq: Once | INTRAMUSCULAR | Status: AC
  Administered 2024-10-24: 600 mg via INTRAMUSCULAR

## 2024-10-24 NOTE — Telephone Encounter (Signed)
 [facility] Patient Advocate Encounter   Was successful in obtaining a [employer] copay card for Yeztugo  Tablets &.Injection  This copay card will make the patients copay 0.00.  I have spoken with the patient.    The billing information is as follows and has been shared with [facility].          Richard Sloan, CPhT Specialty Pharmacy Patient [facility] for [facility] Phone: [phone] Fax:  [phone]

## 2024-10-24 NOTE — Telephone Encounter (Signed)
 Contacted patient's behavioral health provider Suzen Sale at [facility] [phone]) due to potential DDI between Yeztugo  and Vraylar. Plan to start patient on Yeztugo  in April, and package inserts recommend to decrease stable Vraylar dosing of 6 mg daily to 1.5 mg daily when starting a moderate CYP3A4 inhibitor.   Relayed information to [place] over the phone; she will pass information along to [place] and return a call to us  with any updates. Informed me that Phyllip has a scheduled appointment with their team tomorrow to discuss as well.  Alan Geralds, PharmD, CPP, BCIDP, AAHIVP Clinical Pharmacist Practitioner Infectious Diseases Clinical Pharmacist [facility]

## 2024-10-26 LAB — HIV-1 RNA QUANT-NO REFLEX-BLD
HIV 1 RNA Quant: NOT DETECTED {copies}/mL
HIV-1 RNA Quant, Log: NOT DETECTED {Log_copies}/mL

## 2024-12-21 ENCOUNTER — Ambulatory Visit: Payer: Self-pay | Admitting: Pharmacist
